# Patient Record
Sex: Female | Born: 1985
Health system: Southern US, Community
[De-identification: ages and names within clinical notes are randomized; demographics above are authoritative.]

## PROBLEM LIST (undated history)

## (undated) DIAGNOSIS — O139 Gestational [pregnancy-induced] hypertension without significant proteinuria, unspecified trimester: Secondary | ICD-10-CM

## (undated) DIAGNOSIS — Z6791 Unspecified blood type, Rh negative: Secondary | ICD-10-CM

## (undated) DIAGNOSIS — R87619 Unspecified abnormal cytological findings in specimens from cervix uteri: Secondary | ICD-10-CM

## (undated) DIAGNOSIS — I219 Acute myocardial infarction, unspecified: Secondary | ICD-10-CM

## (undated) DIAGNOSIS — Z800 Family history of malignant neoplasm of digestive organs: Secondary | ICD-10-CM

## (undated) DIAGNOSIS — O26899 Other specified pregnancy related conditions, unspecified trimester: Secondary | ICD-10-CM

## (undated) DIAGNOSIS — F32A Depression, unspecified: Secondary | ICD-10-CM

## (undated) DIAGNOSIS — O30009 Twin pregnancy, unspecified number of placenta and unspecified number of amniotic sacs, unspecified trimester: Secondary | ICD-10-CM

## (undated) DIAGNOSIS — F419 Anxiety disorder, unspecified: Secondary | ICD-10-CM

## (undated) DIAGNOSIS — J439 Emphysema, unspecified: Secondary | ICD-10-CM

## (undated) HISTORY — DX: Unspecified blood type, rh negative: Z67.91

## (undated) HISTORY — DX: Depression, unspecified: F32.A

## (undated) HISTORY — DX: Emphysema, unspecified: J43.9

## (undated) HISTORY — DX: Unspecified abnormal cytological findings in specimens from cervix uteri: R87.619

## (undated) HISTORY — DX: Other specified pregnancy related conditions, unspecified trimester: O26.899

## (undated) HISTORY — DX: Family history of malignant neoplasm of digestive organs: Z80.0

## (undated) HISTORY — DX: Gestational (pregnancy-induced) hypertension without significant proteinuria, unspecified trimester: O13.9

## (undated) HISTORY — DX: Twin pregnancy, unspecified number of placenta and unspecified number of amniotic sacs, unspecified trimester: O30.009

## (undated) HISTORY — DX: Anxiety disorder, unspecified: F41.9

## (undated) HISTORY — PX: WISDOM TOOTH EXTRACTION: SHX21

## (undated) HISTORY — DX: Acute myocardial infarction, unspecified: I21.9

---

## 2010-09-13 ENCOUNTER — Observation Stay: Payer: Self-pay | Admitting: Obstetrics & Gynecology

## 2010-09-26 ENCOUNTER — Observation Stay: Payer: Self-pay | Admitting: Obstetrics and Gynecology

## 2010-09-27 ENCOUNTER — Inpatient Hospital Stay: Payer: Self-pay

## 2014-01-16 ENCOUNTER — Observation Stay: Payer: Self-pay | Admitting: Obstetrics & Gynecology

## 2014-01-23 ENCOUNTER — Observation Stay: Payer: Self-pay | Admitting: Obstetrics & Gynecology

## 2014-01-30 ENCOUNTER — Observation Stay: Payer: Self-pay | Admitting: Obstetrics & Gynecology

## 2014-02-06 ENCOUNTER — Observation Stay: Payer: Self-pay | Admitting: Obstetrics and Gynecology

## 2014-02-13 ENCOUNTER — Observation Stay: Payer: Self-pay | Admitting: Obstetrics and Gynecology

## 2014-02-13 LAB — PIH PROFILE
Anion Gap: 8 (ref 7–16)
BUN: 9 mg/dL (ref 7–18)
Calcium, Total: 8.9 mg/dL (ref 8.5–10.1)
Chloride: 104 mmol/L (ref 98–107)
Co2: 26 mmol/L (ref 21–32)
Creatinine: 0.71 mg/dL (ref 0.60–1.30)
EGFR (African American): >60
EGFR (Non-African Amer.): >60
Glucose: 100 mg/dL — ABNORMAL HIGH (ref 65–99)
HCT: 36.2 % (ref 35.0–47.0)
HGB: 12.1 g/dL (ref 12.0–16.0)
MCH: 29.7 pg (ref 26.0–34.0)
MCHC: 33.5 g/dL (ref 32.0–36.0)
MCV: 89 fL (ref 80–100)
Osmolality: 274 (ref 275–301)
Platelet: 137 10*3/uL — ABNORMAL LOW (ref 150–440)
Potassium: 3.8 mmol/L (ref 3.5–5.1)
RBC: 4.08 10*6/uL (ref 3.80–5.20)
RDW: 14.2 % (ref 11.5–14.5)
SGOT(AST): 28 U/L (ref 15–37)
Sodium: 138 mmol/L (ref 136–145)
Uric Acid: 5.6 mg/dL (ref 2.6–6.0)
WBC: 7.5 10*3/uL (ref 3.6–11.0)

## 2014-02-13 LAB — PROTEIN / CREATININE RATIO, URINE
Creatinine, Urine: 66.7 mg/dL (ref 30.0–125.0)
Protein, Random Urine: <5 mg/dL — ABNORMAL LOW (ref 0–12)

## 2014-02-17 ENCOUNTER — Other Ambulatory Visit: Payer: Self-pay | Admitting: Obstetrics and Gynecology

## 2014-02-17 LAB — CBC WITH DIFFERENTIAL/PLATELET
Basophil #: 0 10*3/uL (ref 0.0–0.1)
Basophil %: 0.2 %
Eosinophil #: 0 10*3/uL (ref 0.0–0.7)
Eosinophil %: 0.2 %
HCT: 36.3 % (ref 35.0–47.0)
HGB: 12.1 g/dL (ref 12.0–16.0)
Lymphocyte #: 1.4 10*3/uL (ref 1.0–3.6)
Lymphocyte %: 15.7 %
MCH: 29.5 pg (ref 26.0–34.0)
MCHC: 33.5 g/dL (ref 32.0–36.0)
MCV: 88 fL (ref 80–100)
Monocyte #: 0.6 x10 3/mm (ref 0.2–0.9)
Monocyte %: 6.7 %
Neutrophil #: 7 10*3/uL — ABNORMAL HIGH (ref 1.4–6.5)
Neutrophil %: 77.2 %
Platelet: 125 10*3/uL — ABNORMAL LOW (ref 150–440)
RBC: 4.12 10*6/uL (ref 3.80–5.20)
RDW: 14.3 % (ref 11.5–14.5)
WBC: 9.1 10*3/uL (ref 3.6–11.0)

## 2014-02-17 LAB — CREATININE, SERUM
Creatinine: 0.75 mg/dL (ref 0.60–1.30)
EGFR (African American): >60
EGFR (Non-African Amer.): >60

## 2014-02-17 LAB — CREATININE, URINE, RANDOM: Creatinine, Urine Random: 134.4 mg/dL — ABNORMAL HIGH (ref 30.0–125.0)

## 2014-02-17 LAB — HEPATIC FUNCTION PANEL A (ARMC)
Albumin: 2.7 g/dL — ABNORMAL LOW (ref 3.4–5.0)
Alkaline Phosphatase: 184 U/L — ABNORMAL HIGH
Bilirubin, Direct: <0.1 mg/dL (ref 0.00–0.20)
Bilirubin,Total: 0.2 mg/dL (ref 0.2–1.0)
SGOT(AST): 30 U/L (ref 15–37)
SGPT (ALT): 18 U/L (ref 12–78)
Total Protein: 6.6 g/dL (ref 6.4–8.2)

## 2014-02-17 LAB — URIC ACID: Uric Acid: 5.8 mg/dL (ref 2.6–6.0)

## 2014-02-17 LAB — BUN: BUN: 12 mg/dL (ref 7–18)

## 2014-02-17 LAB — PROTEIN, URINE, RANDOM: Protein, Random Urine: 24 mg/dL — ABNORMAL HIGH (ref 0–12)

## 2014-02-19 ENCOUNTER — Inpatient Hospital Stay: Payer: Self-pay | Admitting: Obstetrics and Gynecology

## 2014-02-19 LAB — PROTEIN / CREATININE RATIO, URINE
Creatinine, Urine: 189.8 mg/dL — ABNORMAL HIGH (ref 30.0–125.0)
Protein, Random Urine: 33 mg/dL — ABNORMAL HIGH (ref 0–12)
Protein/Creat. Ratio: 174 mg/gCREAT (ref 0–200)

## 2014-02-19 LAB — CBC WITH DIFFERENTIAL/PLATELET
Basophil #: 0 10*3/uL (ref 0.0–0.1)
Basophil %: 0.3 %
Eosinophil #: 0 10*3/uL (ref 0.0–0.7)
Eosinophil %: 0.5 %
HCT: 35.1 % (ref 35.0–47.0)
HGB: 12 g/dL (ref 12.0–16.0)
Lymphocyte #: 1.4 10*3/uL (ref 1.0–3.6)
Lymphocyte %: 20.5 %
MCH: 30 pg (ref 26.0–34.0)
MCHC: 34.2 g/dL (ref 32.0–36.0)
MCV: 88 fL (ref 80–100)
Monocyte #: 0.5 x10 3/mm (ref 0.2–0.9)
Monocyte %: 7 %
Neutrophil #: 4.9 10*3/uL (ref 1.4–6.5)
Neutrophil %: 71.7 %
Platelet: 119 10*3/uL — ABNORMAL LOW (ref 150–440)
RBC: 4.01 10*6/uL (ref 3.80–5.20)
RDW: 14.2 % (ref 11.5–14.5)
WBC: 6.8 10*3/uL (ref 3.6–11.0)

## 2014-02-20 LAB — HEMATOCRIT: HCT: 33.9 % — ABNORMAL LOW (ref 35.0–47.0)

## 2014-02-21 LAB — PATHOLOGY REPORT

## 2014-10-24 ENCOUNTER — Encounter: Payer: Self-pay | Admitting: *Deleted

## 2014-11-05 ENCOUNTER — Ambulatory Visit: Payer: Self-pay | Admitting: General Surgery

## 2014-11-11 ENCOUNTER — Encounter: Payer: Self-pay | Admitting: *Deleted

## 2015-03-24 NOTE — H&P (Signed)
L&D Evaluation:  History Expanded:  HPI 29 yo G2P1001 at 30w1dgestational age by LMP consistent with 11 week ultrasound.  Pregnancy complicated by Di/Di twins. She presents for routine twin NST.  She is noted to have elevated BPs. She denies headache, visual changes, and RUQ pain.  She notes positive fetal movement x 2 babies, no vaginal bleeding, no leakage of fluid, and no contractions.   Blood Type (Maternal) O negative   Group B Strep Results Maternal (Result >5wks must be treated as unknown) unknown/result > 5 weeks ago   Maternal HIV Negative   Maternal Syphilis Ab Nonreactive   Maternal Varicella Immune   Rubella Results ([facility]aternal) immune   [facility]   Patient's Medical History No Chronic Illness   Patient's Surgical History none   Medications Pre Natal Vitamins   Allergies vicodin   Social History none   Family History Non-Contributory   ROS:  ROS All systems were reviewed.  HEENT, CNS, GI, GU, Respiratory, CV, Renal and Musculoskeletal systems were found to be normal.   Exam:  Vital Signs T98.5, BPs 123-149/61-87, P  80s-90s,   General no apparent distress   Mental Status clear   Chest clear   Heart normal sinus rhythm   Abdomen gravid, non-tender   Back no CVAT   Edema no edema   Mebranes Intact   FHT normal rate with no decels   FHT Description baby A: 135/mod var/+accels/no decels; baby B: 140/mod var/+accels/no decels   Ucx irregular, infrequent   Skin no lesions   Impression:  Impression 1) Intrauterine pregnancy at 358w1destational age, 2) elevated BPs, 3) reactive NST for both fetuses   Plan:  Comments 1) elevated BPs: labs within normal limits.  urine without measurable protein.  no symptoms.  Will have her follow up in 4 days for routine OB and BP check.  Strict precautions given regarding headache, visual changes, RUQ pain, vaginal bleeding, leakage of fluid, fetal movement, and contractions.    2) NSTs: reactive for  both fetuses, cat 1 for both fetuses.   Follow Up Appointment already scheduled   Labs:  Lab Results: Hepatic:  02-Apr-15 15:33   SGOT (AST) 28 (Result(s) reported on 13 Feb 2014 at 03:55PM.)  Routine Chem:  02-Apr-15 15:27   Result Comment PRT/CREA RATIO - UNABLE TO CALCULATE VALUE DUE TO NON-  - NUMERIC VALUE WITHIN THE CALCULATION.  Result(s) reported on 13 Feb 2014 at 05:06PM.    15:33   Uric Acid, Serum 5.6 (Result(s) reported on 13 Feb 2014 at 03:55PM.)  Glucose, Serum  100  BUN 9  Creatinine (comp) 0.71  Sodium, Serum 138  Potassium, Serum 3.8  Chloride, Serum 104  CO2, Serum 26  Osmolality (calc) 274  Anion Gap 8  Calcium (Total), Serum 8.9  eGFR (Non-African American) >60 (eGFR values <606min/1.73 m2 may be an indication of chronic kidney disease (CKD). Calculated eGFR is useful in patients with stable renal function. The eGFR calculation will not be reliable in acutely ill patients when serum creatinine is changing rapidly. It is not useful in  patients on dialysis. The eGFR calculation may not be applicable to patients at the low and high extremes of body sizes, pregnant women, and vegetarians.)  eGFR (African American) >60  Misc Urine Chem:  02-Apr-15 15:27   Creatinine, Urine 66.7  Protein, Random Urine  < 5  Protein/Creat Ratio (comp) SEE COMMENT  Routine Hem:  02-Apr-15 15:33   WBC (CBC) 7.5  RBC (CBC) 4.08  Hemoglobin (CBC) 12.1  Hematocrit (CBC) 36.2  Platelet Count (CBC)  137 (Result(s) reported on 13 Feb 2014 at 03:55PM.)  MCV 89  MCH 29.7  MCHC 33.5  RDW 14.2   Electronic Signatures: Will Bonnet (MD)  (Signed 02-Apr-15 17:28)  Authored: L&D Evaluation, Labs   Last Updated: 02-Apr-15 17:28 by Will Bonnet (MD)

## 2015-03-24 NOTE — H&P (Signed)
L&D Evaluation:  History:  HPI Pt is a 29 yo G2P1001 at [redacted] weeks GA based on a 1st trimester u/s at 11w with a Di-Di twin gestation. She presents today for IOL. Her prenatal course has been significant for resolved placenta previa, and elevated blood pressure at 36 weeks- 130-142/88-90 in the clinic. PIH labs sent at that time. Her prenatal labs are: O-, VI, RI, GBS negative, TDaP given 2/23, RHogam given 2/12. Her twins were vertex/vertex at her last appointment and the patient desires to have a vaginal delivery.   Presents with other, IOL   Patient's Medical History No Chronic Illness   Patient's Surgical History none   Medications Pre Natal Vitamins  omega fatty acids   Allergies other, vicodin   Social History none   Family History Non-Contributory   ROS:  ROS All systems were reviewed.  HEENT, CNS, GI, GU, Respiratory, CV, Renal and Musculoskeletal systems were found to be normal.   Exam:  Vital Signs stable  BP 130's currently   General no apparent distress   Mental Status clear   Chest clear   Heart normal sinus rhythm   Abdomen gravid, non-tender   Back no CVAT   Pelvic 3/90/-2   Mebranes Intact   FHT normal rate with no decels, A- 140's  B- 160 at present   Ucx irregular   Skin dry, no lesions, no rashes   Lymph no lymphadenopathy   Impression:  Impression other, IUP at 37 weeks for Di-DI twins, r/o PIH   Plan:  Plan EFM/NST, pitocin, AROM, epidural for pain, double set up in OR for anticipated SVD   Follow Up Appointment need to schedule   Electronic Signatures: Jannet MantisSubudhi, [place] (CNM)  (Signed 08-Apr-15 09:52)  Authored: L&D Evaluation   Last Updated: 08-Apr-15 09:52 by Jannet MantisSubudhi, Rozella Servello (CNM)

## 2015-03-24 NOTE — H&P (Signed)
L&D Evaluation:  History:  HPI 29 yo G2P1001 at 7523w1d by D=11wk US derived EDC of 03/12/14 with di/di twins presenting for routine scheduled NST.  At the start of her NST the patient displayed a brief 2min decel down to the 70's with return to baseline and reactive tracing thereafter  +FM, no LOF, no VB, no ctx  Last growth scan 01/16/2014 at 334w1d A: 2037g (4lbs 8oz) c/w 55.6%ile SDP 2.1cm B: 1723 (3lb 13oz) c/w 32.3%ile SDP 5.4cm for a discordance of 15%   Presents with NST   Patient's Medical History No Chronic Illness   Patient's Surgical History none   Medications Pre Natal Vitamins   Allergies vicodin   Social History none   Family History Non-Contributory   ROS:  ROS All systems were reviewed.  HEENT, CNS, GI, GU, Respiratory, CV, Renal and Musculoskeletal systems were found to be normal.   Exam:  Vital Signs stable   General no apparent distress   Mental Status clear   Chest no increased work in breathing   Abdomen gravid, non-tender   Estimated Fetal Weight Large for gestational age   Back no CVAT   Edema no edema   FHT A: 140, moderate, +accels, no decels B: 135, moderate variability, +accels, on decels to 70's at start of monitoring   Ucx absent   Impression:  Impression 29 yo G2P1001 at 2823w1d with di/di twin gestation noted to have spontaneous decel on routine scheduled NST   Plan:  Comments 1) NST - will admit for porlonged monitoring.  Reactive tracing on baby A and B since initial decel.  Has follow up growth scan scheduled for 02/10/14  2) O negative / ABSC negative / RI / VZI / HIV neg / HBsAg neg / RPR NR / 1st trimester screen negative / 1-hr OGTT 108     - rhogam given 12/26/13  3) TDAP given 01/06/14, influenza vaccination 09/17/13  4) Disposition Prolonged monitoring, if remains reactive without evidence of other decels discharge home with labor precautions and kick counts   Electronic Signatures: Lorrene ReidStaebler, Adellyn Capek M (MD)  (Signed  26-Mar-15 17:20)  Authored: L&D Evaluation   Last Updated: 26-Mar-15 17:20 by Lorrene ReidStaebler, Emoni Whitworth M (MD)

## 2016-10-18 DIAGNOSIS — Z124 Encounter for screening for malignant neoplasm of cervix: Secondary | ICD-10-CM | POA: Diagnosis not present

## 2016-10-18 DIAGNOSIS — Z30431 Encounter for routine checking of intrauterine contraceptive device: Secondary | ICD-10-CM | POA: Diagnosis not present

## 2016-10-18 DIAGNOSIS — Z01419 Encounter for gynecological examination (general) (routine) without abnormal findings: Secondary | ICD-10-CM | POA: Diagnosis not present

## 2016-10-18 DIAGNOSIS — Z1151 Encounter for screening for human papillomavirus (HPV): Secondary | ICD-10-CM | POA: Diagnosis not present

## 2017-03-06 ENCOUNTER — Other Ambulatory Visit: Payer: Self-pay | Admitting: Obstetrics and Gynecology

## 2017-03-06 MED ORDER — SERTRALINE HCL 50 MG PO TABS: 50.0000 mg | ORAL_TABLET | Freq: Every day | ORAL | 1 refills | Status: DC

## 2017-09-29 ENCOUNTER — Telehealth: Payer: Self-pay

## 2017-09-29 ENCOUNTER — Other Ambulatory Visit: Payer: Self-pay

## 2017-09-29 MED ORDER — SERTRALINE HCL 50 MG PO TABS: 50.0000 mg | ORAL_TABLET | Freq: Every day | ORAL | 2 refills | Status: DC

## 2017-09-29 NOTE — Telephone Encounter (Signed)
Pt calling today requesting refill on zoloft to last to annual appt.

## 2017-09-29 NOTE — Telephone Encounter (Signed)
yes

## 2017-09-29 NOTE — Telephone Encounter (Signed)
Please advise if you want me to refill.  

## 2017-11-16 ENCOUNTER — Ambulatory Visit: Payer: Self-pay | Admitting: Obstetrics and Gynecology

## 2017-12-05 ENCOUNTER — Encounter: Payer: Self-pay | Admitting: Obstetrics and Gynecology

## 2017-12-05 ENCOUNTER — Ambulatory Visit (INDEPENDENT_AMBULATORY_CARE_PROVIDER_SITE_OTHER): Payer: BLUE CROSS/BLUE SHIELD | Admitting: Obstetrics and Gynecology

## 2017-12-05 VITALS — BP 120/78 | HR 59 | Ht 67.0 in | Wt 188.0 lb

## 2017-12-05 DIAGNOSIS — Z713 Dietary counseling and surveillance: Secondary | ICD-10-CM | POA: Diagnosis not present

## 2017-12-05 DIAGNOSIS — Z30431 Encounter for routine checking of intrauterine contraceptive device: Secondary | ICD-10-CM | POA: Diagnosis not present

## 2017-12-05 DIAGNOSIS — F3281 Premenstrual dysphoric disorder: Secondary | ICD-10-CM | POA: Diagnosis not present

## 2017-12-05 DIAGNOSIS — Z01419 Encounter for gynecological examination (general) (routine) without abnormal findings: Secondary | ICD-10-CM | POA: Diagnosis not present

## 2017-12-05 DIAGNOSIS — N920 Excessive and frequent menstruation with regular cycle: Secondary | ICD-10-CM | POA: Diagnosis not present

## 2017-12-05 MED ORDER — SERTRALINE HCL 50 MG PO TABS: 50.0000 mg | ORAL_TABLET | Freq: Every day | ORAL | 3 refills | Status: DC

## 2017-12-05 NOTE — Patient Instructions (Signed)
I value your feedback and entrusting us with your care. If you get a [facility] patient survey, I would appreciate you taking the time to let us know about your experience today. Thank you! 

## 2017-12-05 NOTE — Progress Notes (Signed)
PCP:  Patient, No Pcp Per   Chief Complaint  Patient presents with  . Gynecologic Exam     HPI:      Wendy Vargas is a 32 y.o. [phone] who LMP was Patient's last menstrual period was 11/21/2017., presents today for her annual examination.  Her menses are regular every 28-30 days, lasting 8-10 days, heavy flow for 2 days.  Dysmenorrhea none. She does not have intermenstrual bleeding. She has paragard IUD and periods are worse. She tried lysteda last yr without any sx relief. She has PMDD sx and takes zoloft daily with sx improvement of sx. No side effects. Wants to cont.   Sex activity: single partner, contraception - IUD. Paragard placed 04/22/14. Trying to get husband to have vasectomy so pt can remove IUD.  Last Pap: October 19, 2016  Results were: no abnormalities /neg HPV DNA  Hx of STDs: none  There is no FH of breast cancer. There is no FH of ovarian cancer. The patient does do self-breast exams.  Tobacco use: The patient denies current or previous tobacco use. Alcohol use: social drinker No drug use.  Exercise: very active  She does get adequate calcium and Vitamin D in her diet.  She is still trying to lose wt. Exercises hard 6 days a wk. Gets about 1500 cal a day. Eats protein sources although has given up most meat/all dairy. Up 8 # from last yr. Clothes don't fit differently.    Past Medical History:  Diagnosis Date  . Abnormal Pap smear of cervix    asc-us  . Gestational hypertension   . Rh negative, antepartum, unspecified trimester   . Twin pregnancy     History reviewed. No pertinent surgical history.  Family History  Problem Relation Age of Onset  . Pancreatic cancer Maternal Grandmother   . Stomach cancer Maternal Grandfather     Social History   Socioeconomic History  . Marital status: Married    Spouse name: Not on file  . Number of children: Not on file  . Years of education: Not on file  . Highest education level: Not on file    Social Needs  . Financial resource strain: Not on file  . Food insecurity - worry: Not on file  . Food insecurity - inability: Not on file  . Transportation needs - medical: Not on file  . Transportation needs - non-medical: Not on file  Occupational History  . Not on file  Tobacco Use  . Smoking status: Never Smoker  . Smokeless tobacco: Never Used  Substance and Sexual Activity  . Alcohol use: Yes  . Drug use: No  . Sexual activity: Yes    Birth control/protection: IUD  Other Topics Concern  . Not on file  Social History Narrative  . Not on file    Current Meds  Medication Sig  . PARAGARD INTRAUTERINE COPPER IU by Intrauterine route.     ROS:  Review of Systems  Constitutional: Negative for fatigue, fever and unexpected weight change.  Respiratory: Negative for cough, shortness of breath and wheezing.   Cardiovascular: Negative for chest pain, palpitations and leg swelling.  Gastrointestinal: Negative for blood in stool, constipation, diarrhea, nausea and vomiting.  Endocrine: Negative for cold intolerance, heat intolerance and polyuria.  Genitourinary: Negative for dyspareunia, dysuria, flank pain, frequency, genital sores, hematuria, menstrual problem, pelvic pain, urgency, vaginal bleeding, vaginal discharge and vaginal pain.  Musculoskeletal: Negative for back pain, joint swelling and myalgias.  Skin: Negative  for rash.  Neurological: Negative for dizziness, syncope, light-headedness, numbness and headaches.  Hematological: Negative for adenopathy.  Psychiatric/Behavioral: Negative for agitation, confusion, sleep disturbance and suicidal ideas. The patient is not nervous/anxious.      Objective: BP 120/78   Pulse (!) 59   Ht 5\' 7"  (1.702 m)   Wt 188 lb (85.3 kg)   LMP 11/21/2017   BMI 29.44 kg/m    Physical Exam  Constitutional: She is oriented to person, place, and time. She appears well-developed and well-nourished.  Genitourinary: Vagina normal and  uterus normal. There is no rash or tenderness on the right labia. There is no rash or tenderness on the left labia. No erythema or tenderness in the vagina. No vaginal discharge found. Right adnexum does not display mass and does not display tenderness. Left adnexum does not display mass and does not display tenderness.  Cervix exhibits visible IUD strings. Cervix does not exhibit motion tenderness or polyp. Uterus is not enlarged or tender.  Neck: Normal range of motion. No thyromegaly present.  Cardiovascular: Normal rate, regular rhythm and normal heart sounds.  No murmur heard. Pulmonary/Chest: Effort normal and breath sounds normal. Right breast exhibits no mass, no nipple discharge, no skin change and no tenderness. Left breast exhibits no mass, no nipple discharge, no skin change and no tenderness.  Abdominal: Soft. There is no tenderness. There is no guarding.  Musculoskeletal: Normal range of motion.  Neurological: She is alert and oriented to person, place, and time. No cranial nerve deficit.  Psychiatric: She has a normal mood and affect. Her behavior is normal.  Vitals reviewed.   Assessment/Plan: Encounter for annual routine gynecological examination  Encounter for routine checking of intrauterine contraceptive device (IUD) - Paragard in place. Due for rem 6/25.   Menorrhagia with regular cycle - No improvement with lysteda. Sx for 2 days. Pt ok with IUD currently. Add MVI with iron.  PMDD (premenstrual dysphoric disorder) - Rx RF zoloft. Pt takes daily now with sx improvement overall. - Plan: sertraline (ZOLOFT) 50 MG tablet  Weight loss counseling, encounter for - Cont exercise,adjust caloric intake. Decrease carbs, increase fruits/veggies/cont protein. May need to slightly increase calories.If no change, check labs.   Meds ordered this encounter  Medications  . sertraline (ZOLOFT) 50 MG tablet    Sig: Take 1 tablet (50 mg total) by mouth daily.    Dispense:  90 tablet     Refill:  3             GYN counsel family planning choices, adequate intake of calcium and vitamin D, diet and exercise     F/U  Return in about 1 year (around 12/05/2018).  Alicia B. Copland, PA-C 12/05/2017 12:04 PM

## 2018-12-10 ENCOUNTER — Other Ambulatory Visit: Payer: Self-pay | Admitting: Obstetrics and Gynecology

## 2018-12-10 DIAGNOSIS — F3281 Premenstrual dysphoric disorder: Secondary | ICD-10-CM

## 2018-12-10 NOTE — Telephone Encounter (Signed)
Please advise 

## 2018-12-25 ENCOUNTER — Encounter: Payer: Self-pay | Admitting: Obstetrics and Gynecology

## 2018-12-25 ENCOUNTER — Ambulatory Visit (INDEPENDENT_AMBULATORY_CARE_PROVIDER_SITE_OTHER): Payer: BLUE CROSS/BLUE SHIELD | Admitting: Obstetrics and Gynecology

## 2018-12-25 VITALS — BP 124/90 | HR 85 | Ht 67.0 in | Wt 193.0 lb

## 2018-12-25 DIAGNOSIS — Z30431 Encounter for routine checking of intrauterine contraceptive device: Secondary | ICD-10-CM

## 2018-12-25 DIAGNOSIS — N649 Disorder of breast, unspecified: Secondary | ICD-10-CM

## 2018-12-25 DIAGNOSIS — L988 Other specified disorders of the skin and subcutaneous tissue: Secondary | ICD-10-CM

## 2018-12-25 DIAGNOSIS — Z01419 Encounter for gynecological examination (general) (routine) without abnormal findings: Secondary | ICD-10-CM

## 2018-12-25 DIAGNOSIS — F3281 Premenstrual dysphoric disorder: Secondary | ICD-10-CM

## 2018-12-25 DIAGNOSIS — R030 Elevated blood-pressure reading, without diagnosis of hypertension: Secondary | ICD-10-CM

## 2018-12-25 MED ORDER — SERTRALINE HCL 50 MG PO TABS: 50.0000 mg | ORAL_TABLET | Freq: Every day | ORAL | 3 refills | Status: DC

## 2018-12-25 NOTE — Progress Notes (Signed)
PCP:  Patient, No Pcp Per   Chief Complaint  Patient presents with  . Gynecologic Exam     HPI:      [name] is a 33 y.o. [phone] who LMP was Patient's last menstrual period was 12/12/2018 (approximate)., presents today for her annual examination.  Her menses are regular every 28-30 days, lasting 8-10 days, heavy flow for 2 days.  Dysmenorrhea none. She does not have intermenstrual bleeding. She has paragard IUD and periods are worse. She tried lysteda in past without any sx relief. She has PMDD sx and takes zoloft daily with sx improvement of sx. No side effects. Wants to cont.   Sex activity: single partner, contraception - IUD. Paragard placed 04/22/14. Trying to get husband to have vasectomy so pt can remove IUD.  Last Pap: October 19, 2016  Results were: no abnormalities /neg HPV DNA  Hx of STDs: none  There is no FH of breast cancer. There is no FH of ovarian cancer. The patient does do self-breast exams. Pt noticed a red area on the LT breast a few days ago. Itchy and looks like previous "ringworm". Started OTC antifungal med.   Tobacco use: The patient denies current or previous tobacco use. Alcohol use: social drinker No drug use.  Exercise: very active  She does get adequate calcium and Vitamin D in her diet.   Past Medical History:  Diagnosis Date  . Abnormal Pap smear of cervix    asc-us  . Gestational hypertension   . Rh negative, antepartum, unspecified trimester   . Twin pregnancy     History reviewed. No pertinent surgical history.  Family History  Problem Relation Age of Onset  . Pancreatic cancer Maternal Grandmother   . Stomach cancer Maternal Grandfather     Social History   Socioeconomic History  . Marital status: Married    Spouse name: Not on file  . Number of children: Not on file  . Years of education: Not on file  . Highest education level: Not on file  Occupational History  . Not on file  Social Needs  . Financial  resource strain: Not on file  . Food insecurity:    Worry: Not on file    Inability: Not on file  . Transportation needs:    Medical: Not on file    Non-medical: Not on file  Tobacco Use  . Smoking status: Never Smoker  . Smokeless tobacco: Never Used  Substance and Sexual Activity  . Alcohol use: Yes  . Drug use: No  . Sexual activity: Yes    Birth control/protection: I.U.D.    Comment: Paragard  Lifestyle  . Physical activity:    Days per week: Not on file    Minutes per session: Not on file  . Stress: Not on file  Relationships  . Social connections:    Talks on phone: Not on file    Gets together: Not on file    Attends religious service: Not on file    Active member of club or organization: Not on file    Attends meetings of clubs or organizations: Not on file    Relationship status: Not on file  . Intimate partner violence:    Fear of current or ex partner: Not on file    Emotionally abused: Not on file    Physically abused: Not on file    Forced sexual activity: Not on file  Other Topics Concern  . Not on file  Social History Narrative  . Not on file    Current Meds  Medication Sig  . PARAGARD INTRAUTERINE COPPER IU by Intrauterine route.  . sertraline (ZOLOFT) 50 MG tablet Take 1 tablet (50 mg total) by mouth daily.  . [DISCONTINUED] sertraline (ZOLOFT) 50 MG tablet Take 1 tablet (50 mg total) by mouth daily.     ROS:  Review of Systems  Constitutional: Positive for fatigue. Negative for fever and unexpected weight change.  Respiratory: Negative for cough, shortness of breath and wheezing.   Cardiovascular: Negative for chest pain, palpitations and leg swelling.  Gastrointestinal: Negative for blood in stool, constipation, diarrhea, nausea and vomiting.  Endocrine: Negative for cold intolerance, heat intolerance and polyuria.  Genitourinary: Negative for dyspareunia, dysuria, flank pain, frequency, genital sores, hematuria, menstrual problem, pelvic  pain, urgency, vaginal bleeding, vaginal discharge and vaginal pain.  Musculoskeletal: Negative for back pain, joint swelling and myalgias.  Skin: Positive for rash.  Neurological: Negative for dizziness, syncope, light-headedness, numbness and headaches.  Hematological: Negative for adenopathy.  Psychiatric/Behavioral: Positive for agitation and dysphoric mood. Negative for confusion, sleep disturbance and suicidal ideas. The patient is not nervous/anxious.      Objective: BP 124/90   Pulse 85   Ht 5\' 7"  (1.702 m)   Wt 193 lb (87.5 kg)   LMP 12/12/2018 (Approximate)   BMI 30.23 kg/m    Physical Exam Constitutional:      Appearance: She is well-developed.  Genitourinary:     Vulva, vagina, uterus, right adnexa and left adnexa normal.     No vulval lesion or tenderness noted.     No vaginal discharge, erythema or tenderness.     No cervical motion tenderness or polyp.     IUD strings visualized.     Uterus is not enlarged or tender.     No right or left adnexal mass present.     Right adnexa not tender.     Left adnexa not tender.  Neck:     Musculoskeletal: Normal range of motion.     Thyroid: No thyromegaly.  Cardiovascular:     Rate and Rhythm: Normal rate and regular rhythm.     Heart sounds: Normal heart sounds. No murmur.  Pulmonary:     Effort: Pulmonary effort is normal.     Breath sounds: Normal breath sounds.  Chest:     Breasts:        Right: No mass, nipple discharge, skin change or tenderness.        Left: No mass, nipple discharge, skin change or tenderness.  Abdominal:     Palpations: Abdomen is soft.     Tenderness: There is no abdominal tenderness. There is no guarding.  Musculoskeletal: Normal range of motion.  Neurological:     Mental Status: She is alert and oriented to person, place, and time.     Cranial Nerves: No cranial nerve deficit.  Skin:      Psychiatric:        Behavior: Behavior normal.  Vitals signs reviewed.      Assessment/Plan: Encounter for annual routine gynecological examination  Encounter for routine checking of intrauterine contraceptive device (IUD) - Paragard in place. Pt tolerant of it for now. Hopes husband gets vasectomy so it can be removed.  PMDD (premenstrual dysphoric disorder) - Rx RF zoloft. Pt takes daily with sx improvement overall. - Plan: sertraline (ZOLOFT) 50 MG tablet  Elevated blood pressure reading in office without diagnosis of hypertension - BP improved after exam.  pt to monitor.  Lesion of skin of breast - Question tinea vs other. pt to cont clotrimazole. F/u wiht derm if persists/worsens (has appt in a few wks anyway).  Meds ordered this encounter  Medications  . sertraline (ZOLOFT) 50 MG tablet    Sig: Take 1 tablet (50 mg total) by mouth daily.    Dispense:  90 tablet    Refill:  3    Order Specific Question:   Supervising Provider    Answer:   Nadara MustardHARRIS, ROBERT P [[id number]]             GYN counsel family planning choices, adequate intake of calcium and vitamin D, diet and exercise     F/U  Return in about 1 year (around 12/26/2019).  Elicia Lui B. Tamila Gaulin, PA-C 12/25/2018 1:54 PM

## 2018-12-25 NOTE — Patient Instructions (Signed)
I value your feedback and entrusting us with your care. If you get a [facility] patient survey, I would appreciate you taking the time to let us know about your experience today. Thank you! 

## 2020-01-16 ENCOUNTER — Other Ambulatory Visit: Payer: Self-pay | Admitting: Obstetrics and Gynecology

## 2020-01-16 ENCOUNTER — Telehealth: Payer: Self-pay | Admitting: Obstetrics and Gynecology

## 2020-01-16 DIAGNOSIS — F3281 Premenstrual dysphoric disorder: Secondary | ICD-10-CM

## 2020-01-16 MED ORDER — SERTRALINE HCL 50 MG PO TABS: 50.0000 mg | ORAL_TABLET | Freq: Every day | ORAL | 0 refills | Status: DC

## 2020-01-16 NOTE — Progress Notes (Signed)
Rx RF zoloft till 3/21 annual

## 2020-01-16 NOTE — Telephone Encounter (Signed)
Patient needs refill on generic zoloft, annual scheduled 3/25, if she can get enough to get to that appt.  [facility].

## 2020-01-16 NOTE — Telephone Encounter (Signed)
Called pt, no answer, voice mail not set up.

## 2020-01-16 NOTE — Telephone Encounter (Signed)
Psl notify pt rx eRxd

## 2020-01-16 NOTE — Telephone Encounter (Signed)
Rx eRxd.  

## 2020-02-06 ENCOUNTER — Ambulatory Visit: Payer: Self-pay | Admitting: Obstetrics and Gynecology

## 2020-03-05 ENCOUNTER — Encounter: Payer: Self-pay | Admitting: Obstetrics and Gynecology

## 2020-03-05 ENCOUNTER — Other Ambulatory Visit: Payer: Self-pay

## 2020-03-05 ENCOUNTER — Ambulatory Visit (INDEPENDENT_AMBULATORY_CARE_PROVIDER_SITE_OTHER): Payer: BC Managed Care – PPO | Admitting: Obstetrics and Gynecology

## 2020-03-05 VITALS — BP 130/90 | Ht 67.0 in | Wt 199.0 lb

## 2020-03-05 DIAGNOSIS — Z800 Family history of malignant neoplasm of digestive organs: Secondary | ICD-10-CM | POA: Insufficient documentation

## 2020-03-05 DIAGNOSIS — Z30431 Encounter for routine checking of intrauterine contraceptive device: Secondary | ICD-10-CM

## 2020-03-05 DIAGNOSIS — Z01419 Encounter for gynecological examination (general) (routine) without abnormal findings: Secondary | ICD-10-CM | POA: Diagnosis not present

## 2020-03-05 DIAGNOSIS — F3281 Premenstrual dysphoric disorder: Secondary | ICD-10-CM | POA: Diagnosis not present

## 2020-03-05 DIAGNOSIS — Z808 Family history of malignant neoplasm of other organs or systems: Secondary | ICD-10-CM | POA: Diagnosis not present

## 2020-03-05 MED ORDER — SERTRALINE HCL 50 MG PO TABS: 50.0000 mg | ORAL_TABLET | Freq: Every day | ORAL | 3 refills | Status: DC

## 2020-03-05 NOTE — Progress Notes (Signed)
PCP:  Patient, No Pcp Per   Chief Complaint  Patient presents with  . Gynecologic Exam    mood swings before cycle starts     HPI:      [name] is a 34 y.o. [phone] who LMP was Patient's last menstrual period was 02/13/2020 (approximate)., presents today for her annual examination.  Her menses are regular every 28-30 days, lasting 14 days, heavy flow for 2 days, spotting before and after for 4 days. Dysmenorrhea none. She does not have intermenstrual bleeding. She has paragard IUD and periods are worse. She tried lysteda in past without any sx relief. Plans to get IUD removed after husband has vasectomy and hopes bleeding is better.  She has PMDD sx and takes zoloft daily with sx improvement of sx, except still has a 2-3 days before some periods with worse moods (can't find any triggers). Has tried taking 100 mg dose that days but that makes her lethargic. No other side effects. Wants to cont.   Sex activity: single partner, contraception - IUD. Paragard placed 04/22/14. Husband to have vasectomy so pt can remove IUD.  Last Pap: October 19, 2016  Results were: no abnormalities /neg HPV DNA  Hx of STDs: none  There is no FH of breast cancer. There is no FH of ovarian cancer. The patient does do self-breast exams. There is a FH of pancreatic cancer in her MGM, genetic testing not done.  Tobacco use: The patient denies current or previous tobacco use. Alcohol use: social drinker No drug use.  Exercise: very active  She does get adequate calcium but not Vitamin D in her diet.   Past Medical History:  Diagnosis Date  . Abnormal Pap smear of cervix    asc-us  . Gestational hypertension   . Rh negative, antepartum, unspecified trimester   . Twin pregnancy     History reviewed. No pertinent surgical history.  Family History  Problem Relation Age of Onset  . Pancreatic cancer Maternal Grandmother 52  . Stomach cancer Maternal Grandfather     Social History    Socioeconomic History  . Marital status: Married    Spouse name: Not on file  . Number of children: Not on file  . Years of education: Not on file  . Highest education level: Not on file  Occupational History  . Not on file  Tobacco Use  . Smoking status: Never Smoker  . Smokeless tobacco: Never Used  Substance and Sexual Activity  . Alcohol use: Yes  . Drug use: No  . Sexual activity: Yes    Birth control/protection: I.U.D.    Comment: Paragard  Other Topics Concern  . Not on file  Social History Narrative  . Not on file   Social Determinants of Health   Financial Resource Strain:   . Difficulty of Paying Living Expenses:   Food Insecurity:   . Worried About Charity fundraiser in the Last Year:   . Arboriculturist in the Last Year:   Transportation Needs:   . Film/video editor (Medical):   Marland Kitchen Lack of Transportation (Non-Medical):   Physical Activity:   . Days of Exercise per Week:   . Minutes of Exercise per Session:   Stress:   . Feeling of Stress :   Social Connections:   . Frequency of Communication with Friends and Family:   . Frequency of Social Gatherings with Friends and Family:   . Attends Religious Services:   .  Active Member of Clubs or Organizations:   . Attends Archivist Meetings:   Marland Kitchen Marital Status:   Intimate Partner Violence:   . Fear of Current or Ex-Partner:   . Emotionally Abused:   Marland Kitchen Physically Abused:   . Sexually Abused:     Current Meds  Medication Sig  . PARAGARD INTRAUTERINE COPPER IU by Intrauterine route.  . sertraline (ZOLOFT) 50 MG tablet Take 1 tablet (50 mg total) by mouth daily.  . [DISCONTINUED] sertraline (ZOLOFT) 50 MG tablet Take 1 tablet (50 mg total) by mouth daily.     ROS:  Review of Systems  Constitutional: Positive for fatigue. Negative for fever and unexpected weight change.  Respiratory: Negative for cough, shortness of breath and wheezing.   Cardiovascular: Negative for chest pain,  palpitations and leg swelling.  Gastrointestinal: Negative for blood in stool, constipation, diarrhea, nausea and vomiting.  Endocrine: Negative for cold intolerance, heat intolerance and polyuria.  Genitourinary: Negative for dyspareunia, dysuria, flank pain, frequency, genital sores, hematuria, menstrual problem, pelvic pain, urgency, vaginal bleeding, vaginal discharge and vaginal pain.  Musculoskeletal: Positive for arthralgias. Negative for back pain, joint swelling and myalgias.  Skin: Positive for rash.  Neurological: Negative for dizziness, syncope, light-headedness, numbness and headaches.  Hematological: Negative for adenopathy.  Psychiatric/Behavioral: Positive for agitation and dysphoric mood. Negative for confusion, sleep disturbance and suicidal ideas. The patient is not nervous/anxious.      Objective: BP 130/90   Ht 5' 7" (1.702 m)   Wt 199 lb (90.3 kg)   LMP 02/13/2020 (Approximate)   BMI 31.17 kg/m    Physical Exam Constitutional:      Appearance: She is well-developed.  Genitourinary:     Vulva, vagina, cervix, uterus, right adnexa and left adnexa normal.     No vulval lesion or tenderness noted.     No vaginal discharge, erythema or tenderness.     No cervical polyp.     IUD strings visualized.     Uterus is not enlarged or tender.     No right or left adnexal mass present.     Right adnexa not tender.     Left adnexa not tender.  Neck:     Thyroid: No thyromegaly.  Cardiovascular:     Rate and Rhythm: Normal rate and regular rhythm.     Heart sounds: Normal heart sounds. No murmur.  Pulmonary:     Effort: Pulmonary effort is normal.     Breath sounds: Normal breath sounds.  Chest:     Breasts:        Right: No mass, nipple discharge, skin change or tenderness.        Left: No mass, nipple discharge, skin change or tenderness.  Abdominal:     Palpations: Abdomen is soft.     Tenderness: There is no abdominal tenderness. There is no guarding.    Musculoskeletal:        General: Normal range of motion.     Cervical back: Normal range of motion.  Neurological:     General: No focal deficit present.     Mental Status: She is alert and oriented to person, place, and time.     Cranial Nerves: No cranial nerve deficit.  Skin:    General: Skin is warm and dry.  Psychiatric:        Mood and Affect: Mood normal.        Behavior: Behavior normal.        Thought Content: Thought  content normal.        Judgment: Judgment normal.  Vitals reviewed.     Assessment/Plan: Encounter for annual routine gynecological examination  Encounter for routine checking of intrauterine contraceptive device (IUD); IUD in place. Pt will have removed after vasectomy. Will then see if menses improve. Can check GYN u/s if menorrhagia persists.  PMDD (premenstrual dysphoric disorder) - Plan: sertraline (ZOLOFT) 50 MG tablet; Rx RF. Pt takes daily. Increase to 1 1/2 tabs 1 wk before menses for sx "flare".  Family history of pancreatic cancer - Plan: Integrated [facility] ([place]); MyRisk testing today. Will call with results.   Meds ordered this encounter  Medications  . sertraline (ZOLOFT) 50 MG tablet    Sig: Take 1 tablet (50 mg total) by mouth daily.    Dispense:  90 tablet    Refill:  3    Order Specific Question:   Supervising Provider    Answer:   Gae Dry [[id number]]             GYN counsel adequate intake of calcium and vitamin D, diet and exercise     F/U  Return in about 1 year (around 03/05/2021).  Alicia B. Copland, PA-C 03/05/2020 10:54 AM

## 2020-03-05 NOTE — Patient Instructions (Signed)
I value your feedback and entrusting us with your care. If you get a [facility] patient survey, I would appreciate you taking the time to let us know about your experience today. Thank you!  As of October 24, 2019, your lab results will be released to your MyChart immediately, before I even have a chance to see them. Please give me time to review them and contact you if there are any abnormalities. Thank you for your patience.  

## 2020-03-14 DIAGNOSIS — Z1371 Encounter for nonprocreative screening for genetic disease carrier status: Secondary | ICD-10-CM

## 2020-03-14 HISTORY — DX: Encounter for nonprocreative screening for genetic disease carrier status: Z13.71

## 2020-03-20 ENCOUNTER — Encounter: Payer: Self-pay | Admitting: Obstetrics and Gynecology

## 2020-04-02 ENCOUNTER — Telehealth: Payer: Self-pay | Admitting: Obstetrics and Gynecology

## 2020-04-02 NOTE — Telephone Encounter (Signed)
Pt aware of neg MyRisk results with MSH6 VUS. IBIS=12.7%/riskscore=15.1%. Nothing further to do at this point.   Patient understands these results only apply to her and her children, and this is not indicative of genetic testing results of her other family members. It is recommended that her other family members have genetic testing done.  Pt also understands negative genetic testing doesn't mean she will never get any of these cancers.   Hard copy mailed to pt. F/u prn.

## 2020-05-21 ENCOUNTER — Other Ambulatory Visit: Payer: Self-pay | Admitting: Obstetrics and Gynecology

## 2020-05-21 DIAGNOSIS — F3281 Premenstrual dysphoric disorder: Secondary | ICD-10-CM

## 2020-06-29 ENCOUNTER — Telehealth: Payer: Self-pay

## 2020-06-29 NOTE — Telephone Encounter (Signed)
Pt calling; zoloft wasn't sent in; is almost out.  [phone]

## 2020-06-29 NOTE — Telephone Encounter (Signed)
Pls check with pharm and f/u with pt. 1 yr RF sent 4/21

## 2020-06-29 NOTE — Telephone Encounter (Signed)
Called [facility] where Rx was sent in. Pharmacist says pt never picked it up, will get it ready for her. Called pt to make her aware. She apologizes thinking [facility] was [facility] to [facility] (which she says she hates it there). [facility] removed from preferred pharmacies.

## 2020-11-16 ENCOUNTER — Other Ambulatory Visit: Payer: Self-pay

## 2020-11-16 ENCOUNTER — Emergency Department: Payer: BC Managed Care – PPO

## 2020-11-16 ENCOUNTER — Emergency Department
Admission: EM | Admit: 2020-11-16 | Discharge: 2020-11-16 | Disposition: A | Discharge status: Home / Self Care | Payer: BC Managed Care – PPO | Attending: Emergency Medicine | Admitting: Emergency Medicine

## 2020-11-16 ENCOUNTER — Encounter: Payer: Self-pay | Admitting: Emergency Medicine

## 2020-11-16 DIAGNOSIS — I10 Essential (primary) hypertension: Secondary | ICD-10-CM

## 2020-11-16 DIAGNOSIS — G44209 Tension-type headache, unspecified, not intractable: Secondary | ICD-10-CM | POA: Diagnosis not present

## 2020-11-16 DIAGNOSIS — Z79899 Other long term (current) drug therapy: Secondary | ICD-10-CM | POA: Insufficient documentation

## 2020-11-16 DIAGNOSIS — R519 Headache, unspecified: Secondary | ICD-10-CM | POA: Diagnosis not present

## 2020-11-16 MED ORDER — KETOROLAC TROMETHAMINE 10 MG PO TABS: 10.0000 mg | ORAL_TABLET | Freq: Four times a day (QID) | ORAL | 0 refills | Status: DC | PRN

## 2020-11-16 MED ORDER — AMLODIPINE BESYLATE 5 MG PO TABS: 5.0000 mg | ORAL_TABLET | Freq: Every day | ORAL | 1 refills | Status: DC

## 2020-11-16 MED ORDER — DIPHENHYDRAMINE HCL 25 MG PO CAPS
25.0000 mg | ORAL_CAPSULE | Freq: Once | ORAL | Status: AC
  Administered 2020-11-16: 25 mg via ORAL
  Filled 2020-11-16: qty 1

## 2020-11-16 MED ORDER — DIPHENHYDRAMINE HCL 25 MG PO CAPS: 50.0000 mg | ORAL_CAPSULE | Freq: Four times a day (QID) | ORAL | 0 refills | Status: DC | PRN

## 2020-11-16 MED ORDER — METOCLOPRAMIDE HCL 10 MG PO TABS: 10.0000 mg | ORAL_TABLET | Freq: Four times a day (QID) | ORAL | 0 refills | Status: DC | PRN

## 2020-11-16 MED ORDER — KETOROLAC TROMETHAMINE 10 MG PO TABS
10.0000 mg | ORAL_TABLET | Freq: Once | ORAL | Status: AC
  Administered 2020-11-16: 10 mg via ORAL
  Filled 2020-11-16: qty 1

## 2020-11-16 MED ORDER — METOCLOPRAMIDE HCL 10 MG PO TABS
10.0000 mg | ORAL_TABLET | Freq: Once | ORAL | Status: AC
  Administered 2020-11-16: 10 mg via ORAL
  Filled 2020-11-16: qty 1

## 2020-11-16 NOTE — ED Triage Notes (Signed)
Pt comes into the ED via POV c/o headache that started yesterday.  PT denies any h/o migraines.  Pt Pt states she had a negative COVID test this morning as well.  Pt states the headache is better when she is laying down and the light are turned down.  Pt admits tot nausea, but denies any dizziness.  Pt currently neurologically intact at this time and in NAD.

## 2020-11-16 NOTE — ED Provider Notes (Signed)
[facility] Emergency Department Provider Note  ____________________________________________  Time seen: Approximately 5:36 PM  I have reviewed the triage vital signs and the nursing notes.   HISTORY  Chief Complaint Headache    HPI Wendy Vargas is a 35 y.o. female with a past history of gestational hypertension and preeclampsia who comes ED complaining of generalized headache that started yesterday.  Feels worse in the bilateral upper neck, radiates forward over the top of the scalp.  Constant, better laying down with the lights off.  No aggravating factors.  Associated nausea, no vomiting or vision change.  No change rebuilds coordination or motor strength.  Never had a headache like this before, does not typically get headaches or migraines.  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  She took a Covid test this morning which she reports is negative.      Past Medical History:  Diagnosis Date  . Abnormal Pap smear of cervix    asc-us  . BRCA negative 03/2020   MyRisk neg; IBIS=12.7%/riskscore=15.1%  . Family history of pancreatic cancer   . Gestational hypertension    pre-eclampsia  . Rh negative, antepartum, unspecified trimester   . Twin pregnancy      Patient Active Problem List   Diagnosis Date Noted  . Family history of pancreatic cancer 03/05/2020  . PMDD (premenstrual dysphoric disorder) 12/05/2017     History reviewed. No pertinent surgical history.   Prior to Admission medications   Medication Sig Start Date End Date Taking? Authorizing Provider  amLODipine (NORVASC) 5 MG tablet Take 1 tablet (5 mg total) by mouth daily. 11/16/20  Yes Carrie Mew, MD  diphenhydrAMINE (BENADRYL) 25 mg capsule Take 2 capsules (50 mg total) by mouth every 6 (six) hours as needed. 11/16/20  Yes Carrie Mew, MD  ketorolac (TORADOL) 10  MG tablet Take 1 tablet (10 mg total) by mouth every 6 (six) hours as needed for moderate pain. 11/16/20  Yes Carrie Mew, MD  metoCLOPramide (REGLAN) 10 MG tablet Take 1 tablet (10 mg total) by mouth every 6 (six) hours as needed. 11/16/20  Yes Carrie Mew, MD  PARAGARD INTRAUTERINE COPPER IU by Intrauterine route.    [provider]  sertraline (ZOLOFT) 50 MG tablet Take 1 tablet (50 mg total) by mouth daily. 2/54/27   Copland, Deirdre Evener, PA-C     Allergies Vicodin [hydrocodone-acetaminophen]   Family History  Problem Relation Age of Onset  . Pancreatic cancer Maternal Grandmother 59  . Stomach cancer Maternal Grandfather     Social History Social History   Tobacco Use  . Smoking status: Never Smoker  . Smokeless tobacco: Never Used  Vaping Use  . Vaping Use: Never used  Substance Use Topics  . Alcohol use: Yes  . Drug use: No    Review of Systems  Constitutional:   No fever or chills.  ENT:   No sore throat. No rhinorrhea. Cardiovascular:   No chest pain or syncope. Respiratory:   No dyspnea or cough. Gastrointestinal:   Negative for abdominal pain, vomiting and diarrhea.  Musculoskeletal:   Negative for focal pain or swelling All other systems reviewed and are negative except as documented above in ROS and HPI.  ____________________________________________   PHYSICAL EXAM:  VITAL SIGNS: ED Triage Vitals  Enc Vitals Group     BP 11/16/20 1623 (!) 178/98  Pulse Rate 11/16/20 1623 63     Resp 11/16/20 1623 18     Temp 11/16/20 1623 98.4 F (36.9 C)     Temp Source 11/16/20 1623 Oral     SpO2 11/16/20 1623 100 %     Weight 11/16/20 1624 200 lb (90.7 kg)     Height 11/16/20 1624 '5\' 7"'  (1.702 m)     Head Circumference --      Peak Flow --      Pain Score 11/16/20 1624 10     Pain Loc --      Pain Edu? --      Excl. in [place]? --     Vital signs reviewed, nursing assessments reviewed.   Constitutional:   Alert and oriented. Non-toxic  appearance. Eyes:   Conjunctivae are normal. EOMI. PERRL. ENT      Head:   Normocephalic and atraumatic.      Nose:   Wearing a mask.      Mouth/Throat:   Wearing a mask.      Neck:   No meningismus. Full ROM.  There is tenderness of the neck musculature at the skull base bilaterally reproducing the pain. Cardiovascular:   RRR.  Respiratory:   Unlabored breathing  Musculoskeletal:   Normal range of motion in all extremities. . Neurologic:   Normal speech and language.  Cranial nerves II through XII intact Motor grossly intact. Normal gait.  No pronator drift.  Normal cerebellar function No acute focal neurologic deficits are appreciated.  Skin:    Skin is warm, dry and intact. No rash noted.  No petechiae, purpura, or bullae.  ____________________________________________    LABS (pertinent positives/negatives) (all labs ordered are listed, but only abnormal results are displayed) Labs Reviewed - No data to display ____________________________________________   EKG    ____________________________________________    RADIOLOGY  CT Head Wo Contrast  Result Date: 11/16/2020 CLINICAL DATA:  35 year old female with headache. EXAM: CT HEAD WITHOUT CONTRAST TECHNIQUE: Contiguous axial images were obtained from the base of the skull through the vertex without intravenous contrast. COMPARISON:  None. FINDINGS: Brain: No evidence of acute infarction, hemorrhage, hydrocephalus, extra-axial collection or mass lesion/mass effect. Vascular: No hyperdense vessel or unexpected calcification. Skull: Normal. Negative for fracture or focal lesion. Sinuses/Orbits: No acute finding. Other: None IMPRESSION: Normal noncontrast CT of the brain. Electronically Signed   By: Anner Crete M.D.   On: 11/16/2020 17:03    ____________________________________________   PROCEDURES Procedures  ____________________________________________  DIFFERENTIAL DIAGNOSIS   Surgical hemorrhage, tension  headache, dehydration.  Doubt meningitis encephalitis aneurysm dissection or intracranial hypertension.  CLINICAL IMPRESSION / ASSESSMENT AND PLAN / ED COURSE  Medications ordered in the ED: Medications  ketorolac (TORADOL) tablet 10 mg (10 mg Oral Given 11/16/20 1659)  metoCLOPramide (REGLAN) tablet 10 mg (10 mg Oral Given 11/16/20 1659)  diphenhydrAMINE (BENADRYL) capsule 25 mg (25 mg Oral Given 11/16/20 1659)    Pertinent labs & imaging results that were available during my care of the patient were reviewed by me and considered in my medical decision making (see chart for details).  ELIANE HAMMERSMITH was evaluated in Emergency Department on 11/16/2020 for the symptoms described in the history of present illness. She was evaluated in the context of the global COVID-19 pandemic, which necessitated consideration that the patient might be at risk for infection with the SARS-CoV-2 virus that causes COVID-19. Institutional protocols and algorithms that pertain to the evaluation of patients at risk for COVID-19 are in  a state of rapid change based on information released by regulatory bodies including the CDC and federal and state organizations. These policies and algorithms were followed during the patient's care in the ED.   Patient presents with headache for the past 2 days which matches a tension headache pattern.  She reports it is severe and is associated with elevated blood pressure and she is not a person with a visual migraines, so CT scan obtained which is negative for intracranial hemorrhage.  Reproducibility on exam is reassuring, improvement with lying down is reassuring.  Will treat symptomatically, recommend outpatient follow-up.   ----------------------------------------- 5:58 PM on 11/16/2020 -----------------------------------------  Blood pressure slightly improved at 170/90.  Will start on amlodipine.  Patient will follow up in 1 week for blood pressure and symptom recheck.      ____________________________________________   FINAL CLINICAL IMPRESSION(S) / ED DIAGNOSES    Final diagnoses:  Acute non intractable tension-type headache  Hypertension, unspecified type     ED Discharge Orders         Ordered    amLODipine (NORVASC) 5 MG tablet  Daily        11/16/20 1757    metoCLOPramide (REGLAN) 10 MG tablet  Every 6 hours PRN        11/16/20 1757    ketorolac (TORADOL) 10 MG tablet  Every 6 hours PRN        11/16/20 1757    diphenhydrAMINE (BENADRYL) 25 mg capsule  Every 6 hours PRN        11/16/20 1757          Portions of this note were generated with dragon dictation software. Dictation errors may occur despite best attempts at proofreading.   Carrie Mew, MD 11/16/20 1758

## 2020-11-20 ENCOUNTER — Other Ambulatory Visit: Payer: Self-pay | Admitting: Obstetrics and Gynecology

## 2020-11-20 ENCOUNTER — Encounter: Payer: Self-pay | Admitting: Obstetrics and Gynecology

## 2020-11-20 MED ORDER — KETOROLAC TROMETHAMINE 10 MG PO TABS
10.0000 mg | ORAL_TABLET | Freq: Four times a day (QID) | ORAL | 0 refills | Status: DC | PRN
Start: 2020-11-20 — End: 2021-01-13

## 2020-11-20 NOTE — Progress Notes (Signed)
Rx RF toradol prn headache

## 2021-01-13 ENCOUNTER — Encounter: Payer: Self-pay | Admitting: Physician Assistant

## 2021-01-13 ENCOUNTER — Ambulatory Visit: Payer: BC Managed Care – PPO | Admitting: Physician Assistant

## 2021-01-13 ENCOUNTER — Other Ambulatory Visit: Payer: Self-pay

## 2021-01-13 VITALS — BP 140/82 | HR 74 | Temp 98.4°F | Wt 217.6 lb

## 2021-01-13 DIAGNOSIS — F3281 Premenstrual dysphoric disorder: Secondary | ICD-10-CM | POA: Diagnosis not present

## 2021-01-13 DIAGNOSIS — E669 Obesity, unspecified: Secondary | ICD-10-CM | POA: Diagnosis not present

## 2021-01-13 DIAGNOSIS — Z6834 Body mass index (BMI) 34.0-34.9, adult: Secondary | ICD-10-CM

## 2021-01-13 DIAGNOSIS — R519 Headache, unspecified: Secondary | ICD-10-CM

## 2021-01-13 NOTE — Progress Notes (Signed)
New patient visit   Patient: Wendy Vargas   DOB: [birth year]   35 y.o. Female  MRN: [id number] Visit Date: 01/13/2021  Today's healthcare provider: Trinna Post, PA-C   Chief Complaint  Patient presents with  . New Patient (Initial Visit)  I,Porsha C McClurkin,acting as a scribe for Trinna Post, PA-C.,have documented all relevant documentation on the behalf of Trinna Post, PA-C,as directed by  Trinna Post, PA-C while in the presence of Trinna Post, PA-C.  Subjective    Wendy Vargas is a 35 y.o. female who presents today as a new patient to establish care.  HPI   Anxiety:currently taking Sertraline 50 mg. She gets this from [facility] and feels she is doing well.   Migraines: was taking metoclopramide and toradol, but currently not taking anything. She reports headaches have resolved since visit to the ER last month.   Weight Gain: Reports she has gained 10 pounds per year for the last two years. Reports working out and eating at a calorie deficit but not losing weight. Last labs 2015.   Wt Readings from Last 3 Encounters:  01/13/21 217 lb 9.6 oz (98.7 kg)  11/16/20 200 lb (90.7 kg)  03/05/20 199 lb (90.3 kg)     Past Medical History:  Diagnosis Date  . Abnormal Pap smear of cervix    asc-us  . Anxiety   . BRCA negative 03/2020   MyRisk neg; IBIS=12.7%/riskscore=15.1%  . Emphysema of lung ([place])   . Family history of pancreatic cancer   . Gestational hypertension    pre-eclampsia  . Myocardial infarction ([place])   . Rh negative, antepartum, unspecified trimester   . Twin pregnancy    History reviewed. No pertinent surgical history. Family Status  Relation Name Status  . MGM  Deceased  . MGF  Alive  . Mother  Alive  . Father  Alive  . PGM  (Not Specified)   Family History  Problem Relation Age of Onset  . Pancreatic cancer Maternal Grandmother 20  . Stomach cancer Maternal Grandfather   . Depression Mother   . Diabetes Father    . Congestive Heart Failure Paternal Grandmother    Social History   Socioeconomic History  . Marital status: Married    Spouse name: Not on file  . Number of children: Not on file  . Years of education: Not on file  . Highest education level: Not on file  Occupational History  . Not on file  Tobacco Use  . Smoking status: Never Smoker  . Smokeless tobacco: Never Used  Vaping Use  . Vaping Use: Never used  Substance and Sexual Activity  . Alcohol use: Yes    Alcohol/week: 5.0 standard drinks    Types: 3 Glasses of wine, 2 Shots of liquor per week  . Drug use: No  . Sexual activity: Yes    Birth control/protection: I.U.D.    Comment: Paragard  Other Topics Concern  . Not on file  Social History Narrative  . Not on file   Social Determinants of Health   Financial Resource Strain: Not on file  Food Insecurity: Not on file  Transportation Needs: Not on file  Physical Activity: Not on file  Stress: Not on file  Social Connections: Not on file   Outpatient Medications Prior to Visit  Medication Sig  . PARAGARD INTRAUTERINE COPPER IU by Intrauterine route.  . sertraline (ZOLOFT) 50 MG tablet Take 1 tablet (50 mg total) by  mouth daily.  Marland Kitchen amLODipine (NORVASC) 5 MG tablet Take 1 tablet (5 mg total) by mouth daily. (Patient not taking: Reported on 01/13/2021)  . diphenhydrAMINE (BENADRYL) 25 mg capsule Take 2 capsules (50 mg total) by mouth every 6 (six) hours as needed. (Patient not taking: Reported on 01/13/2021)  . ketorolac (TORADOL) 10 MG tablet Take 1 tablet (10 mg total) by mouth every 6 (six) hours as needed for moderate pain. (Patient not taking: Reported on 01/13/2021)  . metoCLOPramide (REGLAN) 10 MG tablet Take 1 tablet (10 mg total) by mouth every 6 (six) hours as needed. (Patient not taking: Reported on 01/13/2021)   No facility-administered medications prior to visit.   Allergies  Allergen Reactions  . Vicodin [Hydrocodone-Acetaminophen]     Immunization History   Administered Date(s) Administered  . Influenza-Unspecified 11/20/2017  . Moderna Sars-Covid-2 Vaccination 01/27/2020, 02/24/2020    Health Maintenance  Topic Date Due  . Hepatitis C Screening  Never done  . HIV Screening  Never done  . TETANUS/TDAP  Never done  . INFLUENZA VACCINE  06/14/2020  . COVID-19 Vaccine (3 - Booster for Moderna series) 08/25/2020  . PAP SMEAR-Modifier  10/18/2021  . HPV VACCINES  Aged Out    Patient Care Team: Paulene Floor as PCP - General (Physician Assistant)  Review of Systems  Constitutional: Positive for fatigue and unexpected weight change.  HENT: Negative.   Eyes: Negative.   Respiratory: Negative.   Cardiovascular: Negative.   Gastrointestinal: Positive for abdominal distention, constipation and diarrhea.  Endocrine: Negative.   Genitourinary: Negative.   Musculoskeletal: Negative.   Skin: Negative.   Allergic/Immunologic: Negative.   Neurological: Positive for headaches.  Hematological: Negative.   Psychiatric/Behavioral: Positive for decreased concentration. The patient is nervous/anxious.       Objective    BP 140/82 (BP Location: Left Arm, Patient Position: Sitting, Cuff Size: Large)   Pulse 74   Temp 98.4 F (36.9 C) (Oral)   Wt 217 lb 9.6 oz (98.7 kg)   LMP 01/03/2021   SpO2 99%   BMI 34.08 kg/m  Physical Exam Constitutional:      Appearance: Normal appearance.  HENT:     Right Ear: Tympanic membrane, ear canal and external ear normal.     Left Ear: Tympanic membrane, ear canal and external ear normal.  Cardiovascular:     Rate and Rhythm: Normal rate and regular rhythm.     Pulses: Normal pulses.     Heart sounds: Normal heart sounds.  Pulmonary:     Effort: Pulmonary effort is normal.     Breath sounds: Normal breath sounds.  Abdominal:     General: Abdomen is flat. Bowel sounds are normal.     Palpations: Abdomen is soft.  Skin:    General: Skin is warm and dry.  Neurological:     General: No  focal deficit present.     Mental Status: She is alert and oriented to person, place, and time.  Psychiatric:        Mood and Affect: Mood normal.        Behavior: Behavior normal.      Depression Screen PHQ 2/9 Scores 01/13/2021  PHQ - 2 Score 2  PHQ- 9 Score 10   No results found for any visits on 01/13/21.  Assessment & Plan     1. Class 1 obesity without serious comorbidity with body mass index (BMI) of 34.0 to 34.9 in adult, unspecified obesity type  Check labs as below.  Offered referrals to nutrition or medical weight management. Patient would like to pursue labwork first.  - TSH - Lipid panel - Comprehensive metabolic panel - CBC with Differential/Platelet  2. PMDD (premenstrual dysphoric disorder)  Continue zoloft.   3. Frequent headaches  Resolved.    No follow-ups on file.     ITrinna Post, PA-C, have reviewed all documentation for this visit. The documentation on 01/13/21 for the exam, diagnosis, procedures, and orders are all accurate and complete.  The entirety of the information documented in the History of Present Illness, Review of Systems and Physical Exam were personally obtained by me. Portions of this information were initially documented by [facility] and reviewed by me for thoroughness and accuracy.     Paulene Floor  [facility] [phone] (phone) [phone] (fax)  [place]

## 2021-01-13 NOTE — Patient Instructions (Signed)
Health Maintenance, Female Adopting a healthy lifestyle and getting preventive care are important in promoting health and wellness. Ask your health care provider about:  The right schedule for you to have regular tests and exams.  Things you can do on your own to prevent diseases and keep yourself healthy. What should I know about diet, weight, and exercise? Eat a healthy diet  Eat a diet that includes plenty of vegetables, fruits, low-fat dairy products, and lean protein.  Do not eat a lot of foods that are high in solid fats, added sugars, or sodium.   Maintain a healthy weight Body mass index (BMI) is used to identify weight problems. It estimates body fat based on height and weight. Your health care provider can help determine your BMI and help you achieve or maintain a healthy weight. Get regular exercise Get regular exercise. This is one of the most important things you can do for your health. Most adults should:  Exercise for at least 150 minutes each week. The exercise should increase your heart rate and make you sweat (moderate-intensity exercise).  Do strengthening exercises at least twice a week. This is in addition to the moderate-intensity exercise.  Spend less time sitting. Even light physical activity can be beneficial. Watch cholesterol and blood lipids Have your blood tested for lipids and cholesterol at 37 years of age, then have this test every 5 years. Have your cholesterol levels checked more often if:  Your lipid or cholesterol levels are high.  You are older than 37 years of age.  You are at high risk for heart disease. What should I know about cancer screening? Depending on your health history and family history, you may need to have cancer screening at various ages. This may include screening for:  Breast cancer.  Cervical cancer.  Colorectal cancer.  Skin cancer.  Lung cancer. What should I know about heart disease, diabetes, and high blood  pressure? Blood pressure and heart disease  High blood pressure causes heart disease and increases the risk of stroke. This is more likely to develop in people who have high blood pressure readings, are of African descent, or are overweight.  Have your blood pressure checked: ? Every 3-5 years if you are 18-39 years of age. ? Every year if you are 40 years old or older. Diabetes Have regular diabetes screenings. This checks your fasting blood sugar level. Have the screening done:  Once every three years after age 40 if you are at a normal weight and have a low risk for diabetes.  More often and at a younger age if you are overweight or have a high risk for diabetes. What should I know about preventing infection? Hepatitis B If you have a higher risk for hepatitis B, you should be screened for this virus. Talk with your health care provider to find out if you are at risk for hepatitis B infection. Hepatitis C Testing is recommended for:  Everyone born from 1945 through 1965.  Anyone with known risk factors for hepatitis C. Sexually transmitted infections (STIs)  Get screened for STIs, including gonorrhea and chlamydia, if: ? You are sexually active and are younger than 37 years of age. ? You are older than 37 years of age and your health care provider tells you that you are at risk for this type of infection. ? Your sexual activity has changed since you were last screened, and you are at increased risk for chlamydia or gonorrhea. Ask your health care provider   if you are at risk.  Ask your health care provider about whether you are at high risk for HIV. Your health care provider may recommend a prescription medicine to help prevent HIV infection. If you choose to take medicine to prevent HIV, you should first get tested for HIV. You should then be tested every 3 months for as long as you are taking the medicine. Pregnancy  If you are about to stop having your period (premenopausal) and  you may become pregnant, seek counseling before you get pregnant.  Take 400 to 800 micrograms (mcg) of folic acid every day if you become pregnant.  Ask for birth control (contraception) if you want to prevent pregnancy. Osteoporosis and menopause Osteoporosis is a disease in which the bones lose minerals and strength with aging. This can result in bone fractures. If you are 65 years old or older, or if you are at risk for osteoporosis and fractures, ask your health care provider if you should:  Be screened for bone loss.  Take a calcium or vitamin D supplement to lower your risk of fractures.  Be given hormone replacement therapy (HRT) to treat symptoms of menopause. Follow these instructions at home: Lifestyle  Do not use any products that contain nicotine or tobacco, such as cigarettes, e-cigarettes, and chewing tobacco. If you need help quitting, ask your health care provider.  Do not use street drugs.  Do not share needles.  Ask your health care provider for help if you need support or information about quitting drugs. Alcohol use  Do not drink alcohol if: ? Your health care provider tells you not to drink. ? You are pregnant, may be pregnant, or are planning to become pregnant.  If you drink alcohol: ? Limit how much you use to 0-1 drink a day. ? Limit intake if you are breastfeeding.  Be aware of how much alcohol is in your drink. In the [place] one drink equals one 12 oz bottle of beer (355 mL), one 5 oz glass of wine (148 mL), or one 1 oz glass of hard liquor (44 mL). General instructions  Schedule regular health, dental, and eye exams.  Stay current with your vaccines.  Tell your health care provider if: ? You often feel depressed. ? You have ever been abused or do not feel safe at home. Summary  Adopting a healthy lifestyle and getting preventive care are important in promoting health and wellness.  Follow your health care provider's instructions about healthy  diet, exercising, and getting tested or screened for diseases.  Follow your health care provider's instructions on monitoring your cholesterol and blood pressure. This information is not intended to replace advice given to you by your health care provider. Make sure you discuss any questions you have with your health care provider. Document Revised: 10/24/2018 Document Reviewed: 10/24/2018 Elsevier Patient Education  2021 Elsevier Inc.  

## 2021-01-14 LAB — COMPREHENSIVE METABOLIC PANEL
ALT: 17 IU/L (ref 0–32)
AST: 24 IU/L (ref 0–40)
Albumin/Globulin Ratio: 1.7 (ref 1.2–2.2)
Albumin: 4.8 g/dL (ref 3.8–4.8)
Alkaline Phosphatase: 68 IU/L (ref 44–121)
BUN/Creatinine Ratio: 19 (ref 9–23)
BUN: 16 mg/dL (ref 6–20)
Bilirubin Total: <0.2 mg/dL (ref 0.0–1.2)
CO2: 22 mmol/L (ref 20–29)
Calcium: 9.5 mg/dL (ref 8.7–10.2)
Chloride: 100 mmol/L (ref 96–106)
Creatinine, Ser: 0.85 mg/dL (ref 0.57–1.00)
Globulin, Total: 2.9 g/dL (ref 1.5–4.5)
Glucose: 91 mg/dL (ref 65–99)
Potassium: 4.5 mmol/L (ref 3.5–5.2)
Sodium: 141 mmol/L (ref 134–144)
Total Protein: 7.7 g/dL (ref 6.0–8.5)
eGFR: 91 mL/min/{1.73_m2} (ref >59)

## 2021-01-14 LAB — CBC WITH DIFFERENTIAL/PLATELET
Basophils Absolute: 0.1 10*3/uL (ref 0.0–0.2)
Basos: 1 %
EOS (ABSOLUTE): 0.1 10*3/uL (ref 0.0–0.4)
Eos: 1 %
Hematocrit: 39.7 % (ref 34.0–46.6)
Hemoglobin: 13.4 g/dL (ref 11.1–15.9)
Immature Grans (Abs): 0 10*3/uL (ref 0.0–0.1)
Immature Granulocytes: 0 %
Lymphocytes Absolute: 2.2 10*3/uL (ref 0.7–3.1)
Lymphs: 31 %
MCH: 29.6 pg (ref 26.6–33.0)
MCHC: 33.8 g/dL (ref 31.5–35.7)
MCV: 88 fL (ref 79–97)
Monocytes Absolute: 0.6 10*3/uL (ref 0.1–0.9)
Monocytes: 9 %
Neutrophils Absolute: 4 10*3/uL (ref 1.4–7.0)
Neutrophils: 58 %
Platelets: 292 10*3/uL (ref 150–450)
RBC: 4.52 x10E6/uL (ref 3.77–5.28)
RDW: 12.4 % (ref 11.7–15.4)
WBC: 7 10*3/uL (ref 3.4–10.8)

## 2021-01-14 LAB — LIPID PANEL
Chol/HDL Ratio: 2.7 ratio (ref 0.0–4.4)
Cholesterol, Total: 216 mg/dL — ABNORMAL HIGH (ref 100–199)
HDL: 79 mg/dL (ref >39)
LDL Chol Calc (NIH): 122 mg/dL — ABNORMAL HIGH (ref 0–99)
Triglycerides: 88 mg/dL (ref 0–149)
VLDL Cholesterol Cal: 15 mg/dL (ref 5–40)

## 2021-01-14 LAB — TSH: TSH: 1.41 u[IU]/mL (ref 0.450–4.500)

## 2021-05-09 ENCOUNTER — Other Ambulatory Visit: Payer: Self-pay | Admitting: Obstetrics and Gynecology

## 2021-05-09 DIAGNOSIS — F3281 Premenstrual dysphoric disorder: Secondary | ICD-10-CM

## 2021-06-18 ENCOUNTER — Telehealth: Payer: Self-pay

## 2021-06-18 NOTE — Telephone Encounter (Signed)
Pt calling; needs sertraline refill; is out; had PE in April c PCP; can she get a refill then have an appt?  [phone]

## 2021-06-18 NOTE — Telephone Encounter (Signed)
Patient is scheduled for 07/27/21 with [facility] for annual. Please advise refill

## 2021-06-19 ENCOUNTER — Other Ambulatory Visit: Payer: Self-pay | Admitting: Obstetrics and Gynecology

## 2021-06-19 DIAGNOSIS — F3281 Premenstrual dysphoric disorder: Secondary | ICD-10-CM

## 2021-06-19 MED ORDER — SERTRALINE HCL 50 MG PO TABS: 50.0000 mg | ORAL_TABLET | Freq: Every day | ORAL | 0 refills | Status: DC

## 2021-06-19 NOTE — Telephone Encounter (Signed)
Rx RF eRxd.  

## 2021-06-19 NOTE — Progress Notes (Signed)
Rx RF sertraline till annual 

## 2021-06-21 NOTE — Telephone Encounter (Signed)
Pt aware.

## 2021-07-27 ENCOUNTER — Ambulatory Visit: Payer: BC Managed Care – PPO | Admitting: Obstetrics and Gynecology

## 2021-07-27 NOTE — Progress Notes (Deleted)
PCP:  Trinna Post, PA-C   No chief complaint on file.    HPI:      [name] is a 35 y.o. [phone] who LMP was No LMP recorded. (Menstrual status: IUD)., presents today for her annual examination.  Her menses are regular every 28-30 days, lasting 14 days, heavy flow for 2 days, spotting before and after for 4 days. Dysmenorrhea none. She does not have intermenstrual bleeding. She has paragard IUD and periods are worse. She tried lysteda in past without any sx relief. Plans to get IUD removed after husband has vasectomy and hopes bleeding is better.  She has PMDD sx and takes zoloft daily with sx improvement of sx, except still has a 2-3 days before some periods with worse moods (can't find any triggers). Has tried taking 100 mg dose that days but that makes her lethargic. No other side effects. Wants to cont.   Sex activity: single partner, contraception - IUD. Paragard placed 04/22/14. Husband to have vasectomy so pt can remove IUD.  Last Pap: October 19, 2016  Results were: no abnormalities /neg HPV DNA  Hx of STDs: none  There is no FH of breast cancer. There is no FH of ovarian cancer. The patient does do self-breast exams. There is a FH of pancreatic cancer in her MGM. Pt is neg MyRisk results with MSH6 VUS. IBIS=12.7%/riskscore=15.1%  Tobacco use: The patient denies current or previous tobacco use. Alcohol use: social drinker No drug use.  Exercise: very active  She does get adequate calcium but not Vitamin D in her diet.   Past Medical History:  Diagnosis Date   Abnormal Pap smear of cervix    asc-us   Anxiety    BRCA negative 03/2020   MyRisk neg; IBIS=12.7%/riskscore=15.1%   Emphysema of lung ([facility])    Family history of pancreatic cancer    Gestational hypertension    pre-eclampsia   Myocardial infarction ([place])    Rh negative, antepartum, unspecified trimester    Twin pregnancy     No past surgical history on file.  Family History  Problem  Relation Age of Onset   Pancreatic cancer Maternal Grandmother 41   Stomach cancer Maternal Grandfather    Depression Mother    Diabetes Father    Congestive Heart Failure Paternal Grandmother     Social History   Socioeconomic History   Marital status: Married    Spouse name: Not on file   Number of children: Not on file   Years of education: Not on file   Highest education level: Not on file  Occupational History   Not on file  Tobacco Use   Smoking status: Never   Smokeless tobacco: Never  Vaping Use   Vaping Use: Never used  Substance and Sexual Activity   Alcohol use: Yes    Alcohol/week: 5.0 standard drinks    Types: 3 Glasses of wine, 2 Shots of liquor per week   Drug use: No   Sexual activity: Yes    Birth control/protection: I.U.D.    Comment: Paragard  Other Topics Concern   Not on file  Social History Narrative   Not on file   Social Determinants of Health   Financial Resource Strain: Not on file  Food Insecurity: Not on file  Transportation Needs: Not on file  Physical Activity: Not on file  Stress: Not on file  Social Connections: Not on file  Intimate Partner Violence: Not on file    No  outpatient medications have been marked as taking for the 07/27/21 encounter (Appointment) with Kendyl Festa, Deirdre Evener, PA-C.     ROS:  Review of Systems  Constitutional:  Positive for fatigue. Negative for fever and unexpected weight change.  Respiratory:  Negative for cough, shortness of breath and wheezing.   Cardiovascular:  Negative for chest pain, palpitations and leg swelling.  Gastrointestinal:  Negative for blood in stool, constipation, diarrhea, nausea and vomiting.  Endocrine: Negative for cold intolerance, heat intolerance and polyuria.  Genitourinary:  Negative for dyspareunia, dysuria, flank pain, frequency, genital sores, hematuria, menstrual problem, pelvic pain, urgency, vaginal bleeding, vaginal discharge and vaginal pain.  Musculoskeletal:   Positive for arthralgias. Negative for back pain, joint swelling and myalgias.  Skin:  Positive for rash.  Neurological:  Negative for dizziness, syncope, light-headedness, numbness and headaches.  Hematological:  Negative for adenopathy.  Psychiatric/Behavioral:  Positive for agitation and dysphoric mood. Negative for confusion, sleep disturbance and suicidal ideas. The patient is not nervous/anxious.     Objective: There were no vitals taken for this visit.   Physical Exam Constitutional:      Appearance: She is well-developed.  Genitourinary:     Vulva normal.     No vaginal discharge, erythema or tenderness.      Right Adnexa: not tender and no mass present.    Left Adnexa: not tender and no mass present.    No cervical polyp.     IUD strings visualized.     Uterus is not enlarged or tender.  Breasts:    Right: No mass, nipple discharge, skin change or tenderness.     Left: No mass, nipple discharge, skin change or tenderness.  Neck:     Thyroid: No thyromegaly.  Cardiovascular:     Rate and Rhythm: Normal rate and regular rhythm.     Heart sounds: Normal heart sounds. No murmur heard. Pulmonary:     Effort: Pulmonary effort is normal.     Breath sounds: Normal breath sounds.  Abdominal:     Palpations: Abdomen is soft.     Tenderness: There is no abdominal tenderness. There is no guarding.  Musculoskeletal:        General: Normal range of motion.     Cervical back: Normal range of motion.  Neurological:     General: No focal deficit present.     Mental Status: She is alert and oriented to person, place, and time.     Cranial Nerves: No cranial nerve deficit.  Skin:    General: Skin is warm and dry.  Psychiatric:        Mood and Affect: Mood normal.        Behavior: Behavior normal.        Thought Content: Thought content normal.        Judgment: Judgment normal.  Vitals reviewed.    Assessment/Plan: Encounter for annual routine gynecological  examination  Encounter for routine checking of intrauterine contraceptive device (IUD); IUD in place. Pt will have removed after vasectomy. Will then see if menses improve. Can check GYN u/s if menorrhagia persists.  PMDD (premenstrual dysphoric disorder) - Plan: sertraline (ZOLOFT) 50 MG tablet; Rx RF. Pt takes daily. Increase to 1 1/2 tabs 1 wk before menses for sx "flare".  Family history of pancreatic cancer - Plan: Integrated BRACAnalysis ([place]); MyRisk testing today. Will call with results.   No orders of the defined types were placed in this encounter.  GYN counsel adequate intake of calcium and vitamin D, diet and exercise     F/U  No follow-ups on file.  Chrishelle Zito B. Vessie Olmsted, PA-C 07/27/2021 11:23 AM

## 2021-10-14 ENCOUNTER — Other Ambulatory Visit: Payer: Self-pay | Admitting: Obstetrics and Gynecology

## 2021-10-14 DIAGNOSIS — F3281 Premenstrual dysphoric disorder: Secondary | ICD-10-CM

## 2021-10-15 NOTE — Telephone Encounter (Signed)
Pt called triage needing 30 day refill of zoloft to last until her next appointment.

## 2021-10-16 ENCOUNTER — Other Ambulatory Visit: Payer: Self-pay | Admitting: Obstetrics and Gynecology

## 2021-10-16 MED ORDER — SERTRALINE HCL 50 MG PO TABS
50.0000 mg | ORAL_TABLET | Freq: Every day | ORAL | 0 refills | Status: DC
Start: 2021-10-16 — End: 2021-11-16

## 2021-10-20 ENCOUNTER — Ambulatory Visit: Payer: Self-pay | Admitting: Obstetrics and Gynecology

## 2021-11-16 ENCOUNTER — Ambulatory Visit (INDEPENDENT_AMBULATORY_CARE_PROVIDER_SITE_OTHER): Payer: BC Managed Care – PPO | Admitting: Obstetrics and Gynecology

## 2021-11-16 ENCOUNTER — Encounter: Payer: Self-pay | Admitting: Obstetrics and Gynecology

## 2021-11-16 ENCOUNTER — Other Ambulatory Visit: Payer: Self-pay

## 2021-11-16 ENCOUNTER — Other Ambulatory Visit (HOSPITAL_COMMUNITY)
Admission: RE | Admit: 2021-11-16 | Discharge: 2021-11-16 | Disposition: A | Discharge status: Home / Self Care | Payer: BC Managed Care – PPO | Source: Ambulatory Visit | Attending: Obstetrics and Gynecology | Admitting: Obstetrics and Gynecology

## 2021-11-16 VITALS — BP 120/90 | Ht 67.0 in | Wt 229.0 lb

## 2021-11-16 DIAGNOSIS — Z124 Encounter for screening for malignant neoplasm of cervix: Secondary | ICD-10-CM

## 2021-11-16 DIAGNOSIS — F3281 Premenstrual dysphoric disorder: Secondary | ICD-10-CM

## 2021-11-16 DIAGNOSIS — Z01419 Encounter for gynecological examination (general) (routine) without abnormal findings: Secondary | ICD-10-CM

## 2021-11-16 DIAGNOSIS — Z1151 Encounter for screening for human papillomavirus (HPV): Secondary | ICD-10-CM

## 2021-11-16 DIAGNOSIS — Z30011 Encounter for initial prescription of contraceptive pills: Secondary | ICD-10-CM

## 2021-11-16 DIAGNOSIS — Z30432 Encounter for removal of intrauterine contraceptive device: Secondary | ICD-10-CM

## 2021-11-16 MED ORDER — SERTRALINE HCL 50 MG PO TABS: 50.0000 mg | ORAL_TABLET | Freq: Every day | ORAL | 3 refills | Status: DC

## 2021-11-16 MED ORDER — DROSPIRENONE-ETHINYL ESTRADIOL 3-0.02 MG PO TABS: 1.0000 | ORAL_TABLET | Freq: Every day | ORAL | 3 refills | Status: DC

## 2021-11-16 NOTE — Progress Notes (Signed)
PCP:  Trinna Post, PA-C (Inactive)   Chief Complaint  Patient presents with   Gynecologic Exam    IUD removal, not sure of new BC     HPI:      Wendy Vargas is a 36 y.o. [phone] who LMP was Patient's last menstrual period was 10/29/2021 (approximate)., presents today for her annual examination.  Her menses are regular every 28-30 days, lasting 7-10 days, heavy flow for 2-3 days, changing products hourly, with quarter to half dollar sized clots. Has BTB, no dysmen. She has paragard IUD and periods are worse. She tried lysteda in past without any sx relief. Planned to get IUD removed after husband had vasectomy but he hasn't done it. She wants IUD removed today anyway. She has PMDD sx and takes zoloft 50 mg daily with sx improvement of sx. Has tried taking 100 mg dose that days but that makes her lethargic.   Sex activity: single partner, contraception - IUD. Paragard placed 04/22/14. Pt wants removed due to menses, and wants to do OCPs again till husband has vasectomy. No hx of HTN, DVTs, migraines with aura. Did OCPs years ago without side effects.  Last Pap: October 19, 2016  Results were: no abnormalities /neg HPV DNA  Hx of STDs: none  There is no FH of breast cancer. There is no FH of ovarian cancer. The patient does do self-breast exams. There is a FH of pancreatic cancer in her MGM. Pt is MyRisk neg 5/21, IBIS=12.7%/riskscore=15.1%.  Tobacco use: The patient denies current or previous tobacco use. Alcohol use: social drinker No drug use.  Exercise: mod active  She does get adequate calcium but not Vitamin D in her diet.   Past Medical History:  Diagnosis Date   Abnormal Pap smear of cervix    asc-us   Anxiety    BRCA negative 03/2020   MyRisk neg; IBIS=12.7%/riskscore=15.1%   Emphysema of lung (HCC)    Family history of pancreatic cancer    Gestational hypertension    pre-eclampsia   Myocardial infarction ([place])    Rh negative, antepartum, unspecified  trimester    Twin pregnancy     Past Surgical History:  Procedure Laterality Date   WISDOM TOOTH EXTRACTION      Family History  Problem Relation Age of Onset   Depression Mother    Diabetes Father    Pancreatic cancer Maternal Grandmother 17   Stomach cancer Maternal Grandfather    Congestive Heart Failure Paternal Grandmother     Social History   Socioeconomic History   Marital status: Married    Spouse name: Not on file   Number of children: Not on file   Years of education: Not on file   Highest education level: Not on file  Occupational History   Not on file  Tobacco Use   Smoking status: Never   Smokeless tobacco: Never  Vaping Use   Vaping Use: Never used  Substance and Sexual Activity   Alcohol use: Yes    Alcohol/week: 5.0 standard drinks    Types: 3 Glasses of wine, 2 Shots of liquor per week   Drug use: No   Sexual activity: Yes    Birth control/protection: I.U.D.    Comment: Paragard  Other Topics Concern   Not on file  Social History Narrative   Not on file   Social Determinants of Health   Financial Resource Strain: Not on file  Food Insecurity: Not on file  Transportation Needs: Not  on file  Physical Activity: Not on file  Stress: Not on file  Social Connections: Not on file  Intimate Partner Violence: Not on file    Current Meds  Medication Sig   drospirenone-ethinyl estradiol (YAZ) 3-0.02 MG tablet Take 1 tablet by mouth daily.   [DISCONTINUED] PARAGARD INTRAUTERINE COPPER IU by Intrauterine route.   [DISCONTINUED] sertraline (ZOLOFT) 50 MG tablet Take 1 tablet (50 mg total) by mouth daily.     ROS:  Review of Systems  Constitutional:  Negative for fatigue, fever and unexpected weight change.  Respiratory:  Negative for cough, shortness of breath and wheezing.   Cardiovascular:  Negative for chest pain, palpitations and leg swelling.  Gastrointestinal:  Negative for blood in stool, constipation, diarrhea, nausea and vomiting.   Endocrine: Negative for cold intolerance, heat intolerance and polyuria.  Genitourinary:  Negative for dyspareunia, dysuria, flank pain, frequency, genital sores, hematuria, menstrual problem, pelvic pain, urgency, vaginal bleeding, vaginal discharge and vaginal pain.  Musculoskeletal:  Negative for back pain, joint swelling and myalgias.  Skin:  Negative for rash.  Neurological:  Negative for dizziness, syncope, light-headedness, numbness and headaches.  Hematological:  Negative for adenopathy.  Psychiatric/Behavioral:  Negative for agitation, confusion, sleep disturbance and suicidal ideas. The patient is not nervous/anxious.     Objective: BP 120/90    Ht '5\' 7"'  (1.702 m)    Wt 229 lb (103.9 kg)    LMP 10/29/2021 (Approximate)    BMI 35.87 kg/m    Physical Exam Constitutional:      Appearance: She is well-developed.  Genitourinary:     Vulva normal.     Right Labia: No rash, tenderness or lesions.    Left Labia: No tenderness, lesions or rash.    No vaginal discharge, erythema or tenderness.      Right Adnexa: not tender and no mass present.    Left Adnexa: not tender and no mass present.    No cervical friability or polyp.     IUD strings visualized.     Uterus is not enlarged or tender.  Breasts:    Right: No mass, nipple discharge, skin change or tenderness.     Left: No mass, nipple discharge, skin change or tenderness.  Neck:     Thyroid: No thyromegaly.  Cardiovascular:     Rate and Rhythm: Normal rate and regular rhythm.     Heart sounds: Normal heart sounds. No murmur heard. Pulmonary:     Effort: Pulmonary effort is normal.     Breath sounds: Normal breath sounds.  Abdominal:     Palpations: Abdomen is soft.     Tenderness: There is no abdominal tenderness. There is no guarding or rebound.  Musculoskeletal:        General: Normal range of motion.     Cervical back: Normal range of motion.  Lymphadenopathy:     Cervical: No cervical adenopathy.   Neurological:     General: No focal deficit present.     Mental Status: She is alert and oriented to person, place, and time.     Cranial Nerves: No cranial nerve deficit.  Skin:    General: Skin is warm and dry.  Psychiatric:        Mood and Affect: Mood normal.        Behavior: Behavior normal.        Thought Content: Thought content normal.        Judgment: Judgment normal.  Vitals reviewed.   IUD Removal Strings of  IUD identified and grasped.  IUD removed without problem with ring forceps.  Pt tolerated this well.  IUD noted to be intact.   Assessment/Plan: Encounter for annual routine gynecological examination  Cervical cancer screening - Plan: Cytology - PAP  Screening for HPV (human papillomavirus) - Plan: Cytology - PAP  Encounter for initial prescription of contraceptive pills - Plan: drospirenone-ethinyl estradiol (YAZ) 3-0.02 MG tablet; OCP start today, Rx eRxd. Condoms for 1 wk. F/u prn.   PMDD (premenstrual dysphoric disorder) - Plan: sertraline (ZOLOFT) 50 MG tablet; Rx RF. May be able to wean off after yaz start. F/u prn.   Encounter for IUD removal--pt tolerated well. IUD intact   Meds ordered this encounter  Medications   drospirenone-ethinyl estradiol (YAZ) 3-0.02 MG tablet    Sig: Take 1 tablet by mouth daily.    Dispense:  90 tablet    Refill:  3    Order Specific Question:   Supervising Provider    Answer:   Gae Dry [[id number]]   sertraline (ZOLOFT) 50 MG tablet    Sig: Take 1 tablet (50 mg total) by mouth daily.    Dispense:  90 tablet    Refill:  3    Order Specific Question:   Supervising Provider    Answer:   Gae Dry [[id number]]             GYN counsel adequate intake of calcium and vitamin D, diet and exercise     F/U  Return in about 1 year (around 11/16/2022).  Dohn Stclair B. Elgie Landino, PA-C 11/16/2021 2:20 PM

## 2021-11-16 NOTE — Patient Instructions (Signed)
I value your feedback and you entrusting us with your care. If you get a [facility] patient survey, I would appreciate you taking the time to let us know about your experience today. Thank you! ? ? ?

## 2021-11-22 LAB — CYTOLOGY - PAP
Comment: NEGATIVE
Diagnosis: NEGATIVE
High risk HPV: NEGATIVE

## 2021-12-12 IMAGING — CT CT HEAD W/O CM
3 series · 16 of 47 positions shown, 19 images · non-contrast
Comparison: None.

CLINICAL DATA: 34-year-old female with headache.

EXAM:
CT HEAD WITHOUT CONTRAST
TECHNIQUE: Contiguous axial images were obtained from the base of the skull
through the vertex without intravenous contrast.

[Series 2: head wo · axial · 0.47mm/px · z∈[-155,-30]mm · 10 of 30 slices shown, 13 images]
[im 3/30  brain]
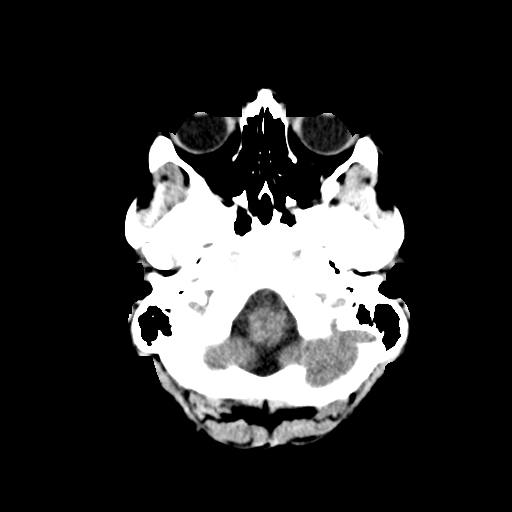
[im 3/30  bone]
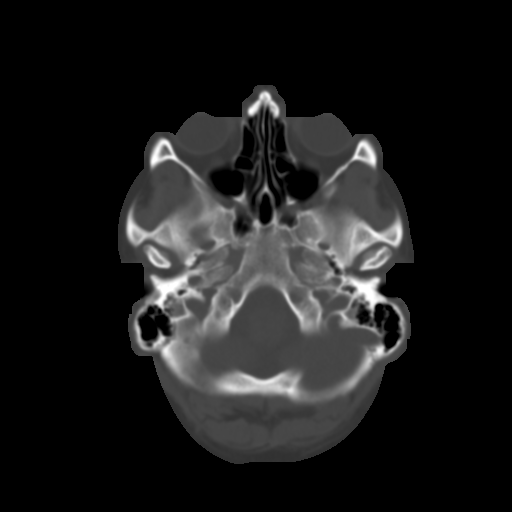
[im 6/30  brain]
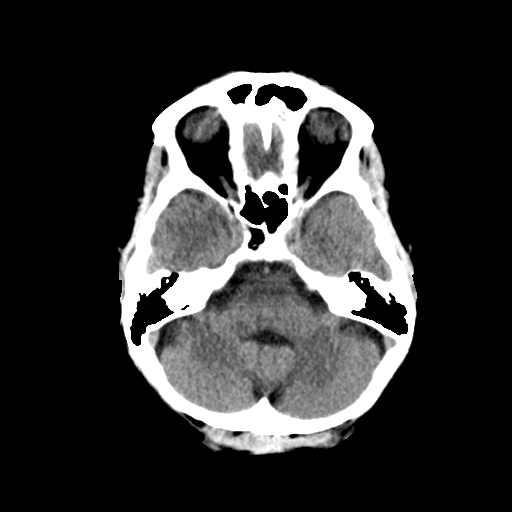
[im 9/30  brain]
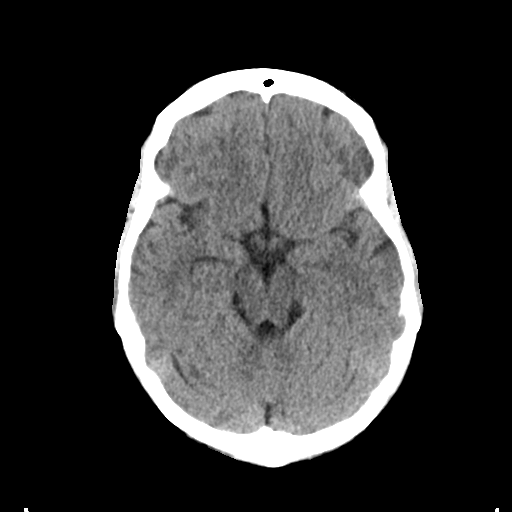
[im 11/30  brain]
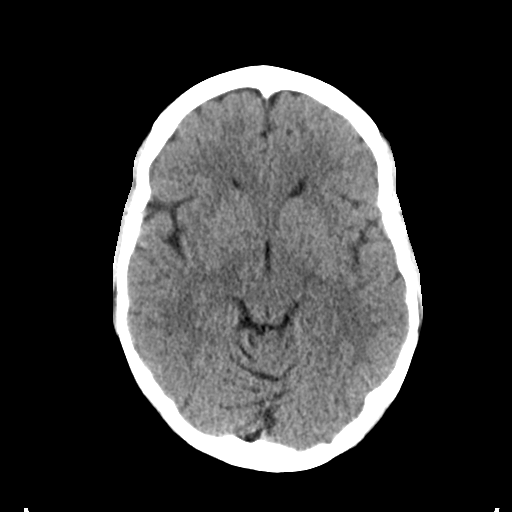
[im 14/30  brain]
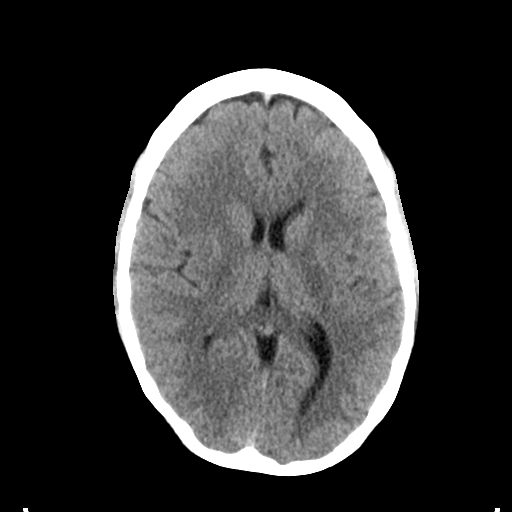
[im 14/30  bone]
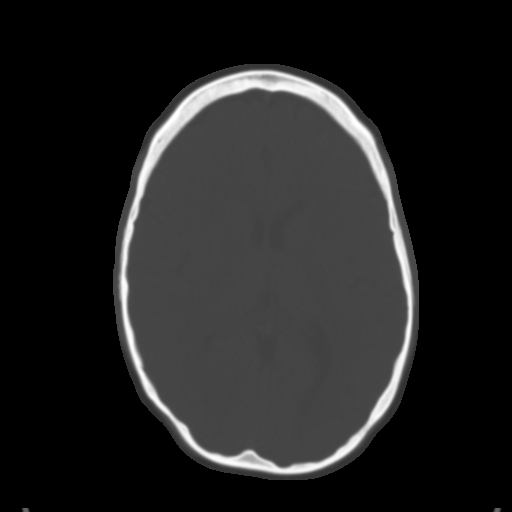
[im 17/30  brain]
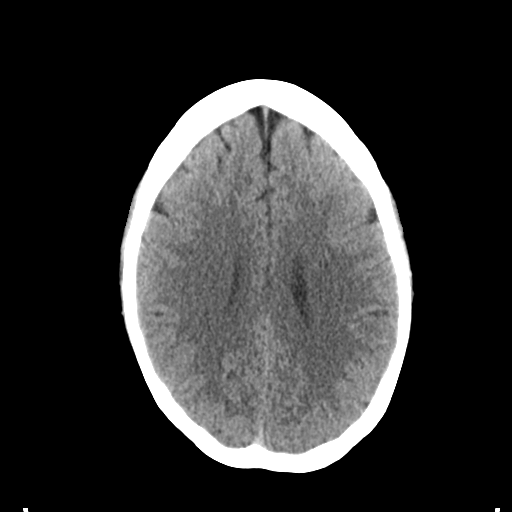
[im 20/30  brain]
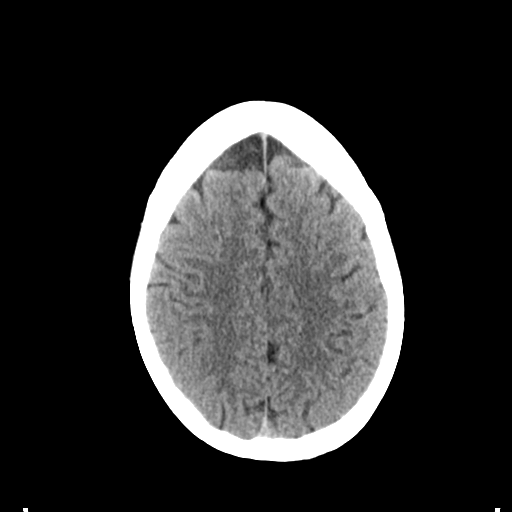
[im 23/30  brain]
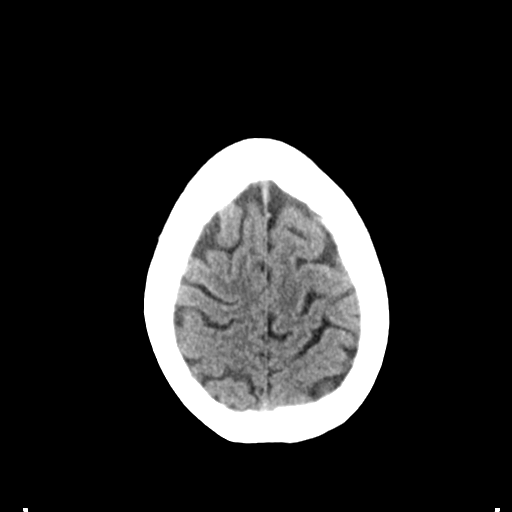
[im 25/30  brain]
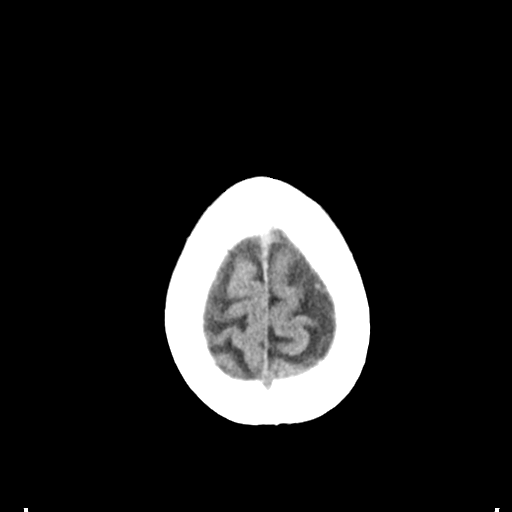
[im 25/30  bone]
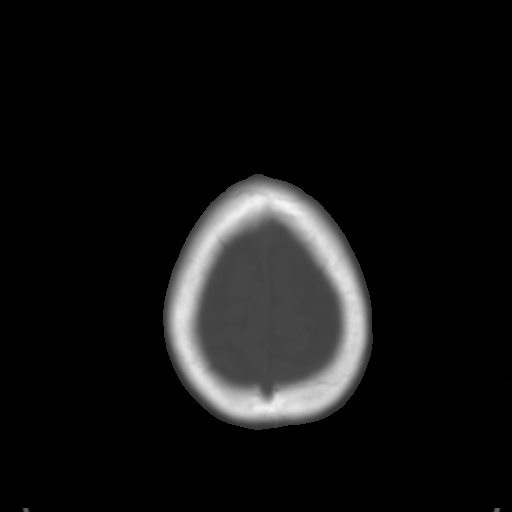
[im 28/30  brain]
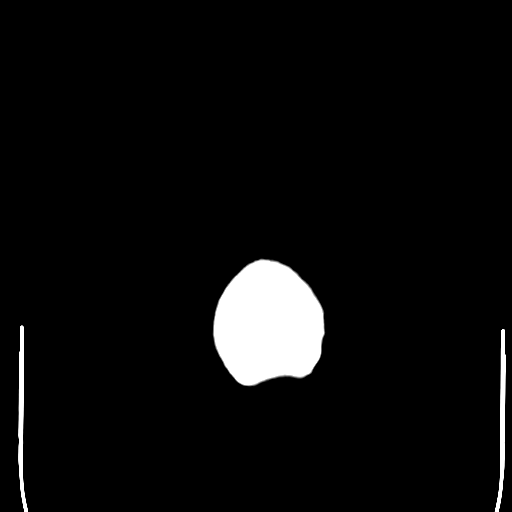

[Series 4: coronal soft tissue · coronal · 0.30mm/px · 3 of 64 slices shown]
[im 22/64  brain]
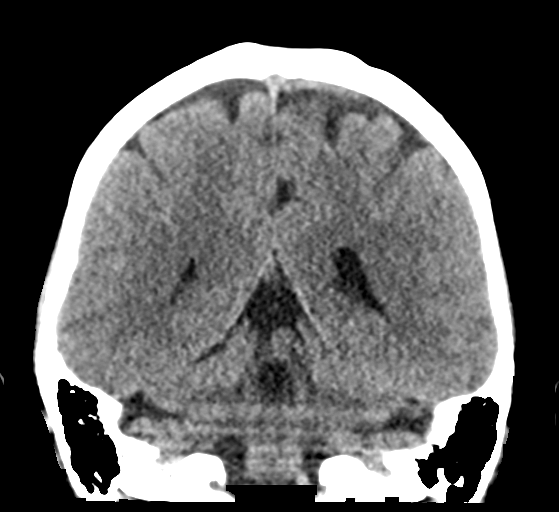
[im 29/64  brain]
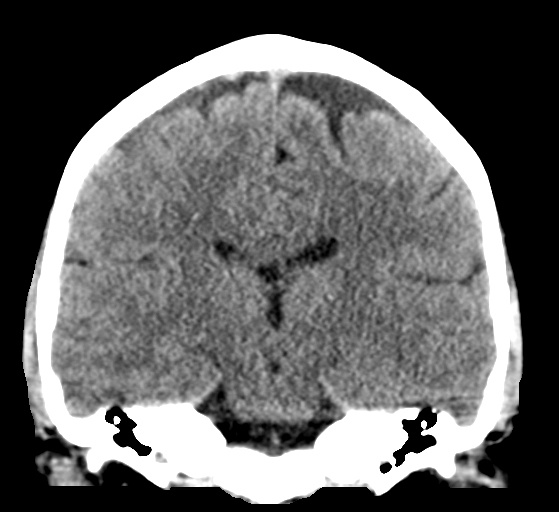
[im 36/64  brain]
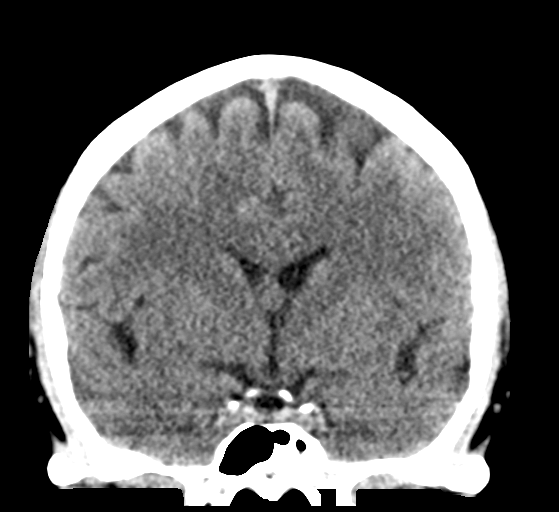

[Series 5: sagittal soft tissue · sagittal · 0.31mm/px · 3 of 52 slices shown]
[im 18/52  brain]
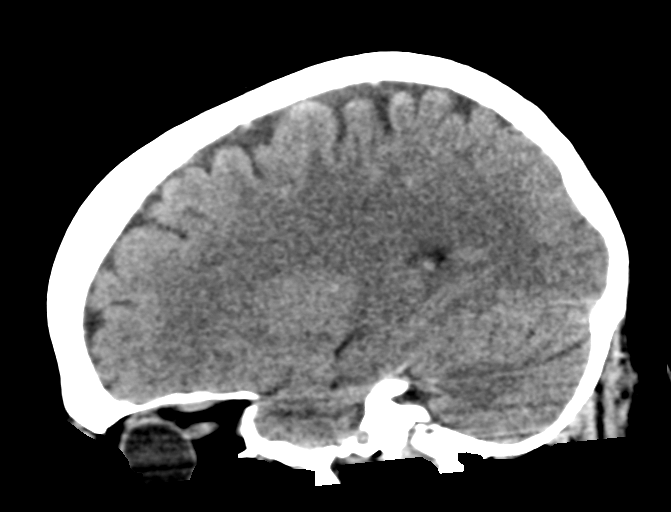
[im 26/52  brain]
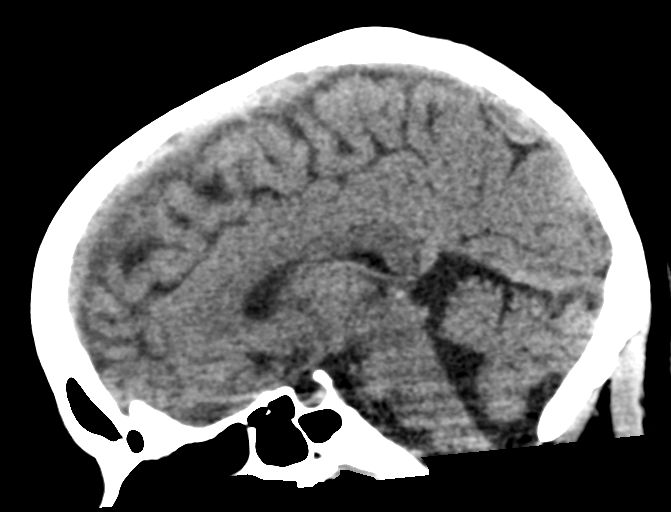
[im 35/52  brain]
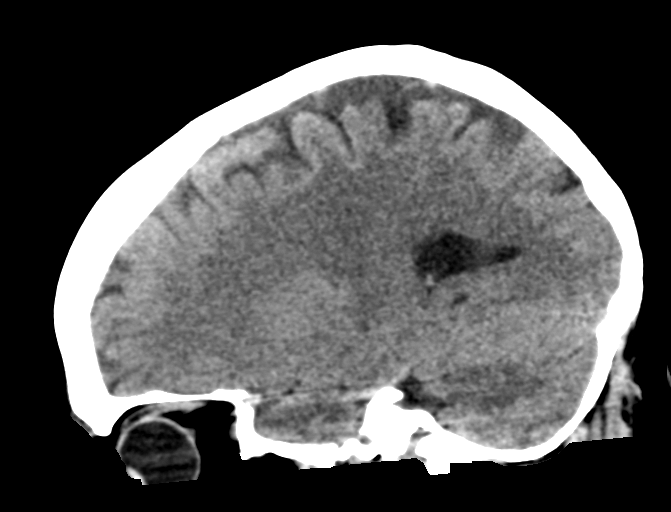

[16 of 47 positions shown; findings below may reference images not displayed]

FINDINGS: Brain: No evidence of acute infarction, hemorrhage, hydrocephalus,
extra-axial collection or mass lesion/mass effect.

Vascular: No hyperdense vessel or unexpected calcification.

Skull: Normal. Negative for fracture or focal lesion.

Sinuses/Orbits: No acute finding.

Other: None
IMPRESSION: Normal noncontrast CT of the brain.

## 2022-06-14 ENCOUNTER — Telehealth: Payer: BC Managed Care – PPO | Admitting: Physician Assistant

## 2022-06-14 DIAGNOSIS — R3989 Other symptoms and signs involving the genitourinary system: Secondary | ICD-10-CM | POA: Diagnosis not present

## 2022-06-14 MED ORDER — CEPHALEXIN 500 MG PO CAPS: 500.0000 mg | ORAL_CAPSULE | Freq: Two times a day (BID) | ORAL | 0 refills | Status: AC

## 2022-06-14 NOTE — Progress Notes (Signed)

## 2022-06-14 NOTE — Progress Notes (Signed)
I have spent 5 minutes in review of e-visit questionnaire, review and updating patient chart, medical decision making and response to patient.   Stormy Connon Cody Linkon Siverson, PA-C    

## 2022-12-05 ENCOUNTER — Telehealth: Payer: BC Managed Care – PPO | Admitting: Physician Assistant

## 2022-12-05 DIAGNOSIS — U071 COVID-19: Secondary | ICD-10-CM | POA: Diagnosis not present

## 2022-12-05 MED ORDER — FLUTICASONE PROPIONATE 50 MCG/ACT NA SUSP: 2.0000 | Freq: Every day | NASAL | 0 refills | Status: DC

## 2022-12-05 MED ORDER — PROMETHAZINE-DM 6.25-15 MG/5ML PO SYRP: 5.0000 mL | ORAL_SOLUTION | Freq: Four times a day (QID) | ORAL | 0 refills | Status: DC | PRN

## 2022-12-05 MED ORDER — LIDOCAINE VISCOUS HCL 2 % MT SOLN: OROMUCOSAL | 0 refills | Status: DC

## 2022-12-05 MED ORDER — BENZONATATE 100 MG PO CAPS: 100.0000 mg | ORAL_CAPSULE | Freq: Three times a day (TID) | ORAL | 0 refills | Status: DC | PRN

## 2022-12-05 MED ORDER — NAPROXEN 500 MG PO TABS: 500.0000 mg | ORAL_TABLET | Freq: Two times a day (BID) | ORAL | 0 refills | Status: DC

## 2022-12-05 NOTE — Addendum Note (Signed)
Addended by: Mar Daring on: 12/05/2022 11:00 AM   Modules accepted: Orders

## 2022-12-05 NOTE — Progress Notes (Signed)
E-Visit  for Positive Covid Test Result  We are sorry you are not feeling well. We are here to help!  You have tested positive for COVID-19, meaning that you were infected with the novel coronavirus and could give the virus to others.  It is vitally important that you stay home so you do not spread it to others.      Please continue isolation at home, for at least 10 days since the start of your symptoms and until you have had 24 hours with no fever (without taking a fever reducer) and with improving of symptoms.  If you have no symptoms but tested positive (or all symptoms resolve after 5 days and you have no fever) you can leave your house but continue to wear a mask around others for an additional 5 days. If you have a fever,continue to stay home until you have had 24 hours of no fever. Most cases improve 5-10 days from onset but we have seen a small number of patients who have gotten worse after the 10 days.  Please be sure to watch for worsening symptoms and remain taking the proper precautions.   Go to the nearest hospital ED for assessment if fever/cough/breathlessness are severe or illness seems like a threat to life.    The following symptoms may appear 2-14 days after exposure: Fever Cough Shortness of breath or difficulty breathing Chills Repeated shaking with chills Muscle pain Headache Sore throat New loss of taste or smell Fatigue Congestion or runny nose Nausea or vomiting Diarrhea  You have been enrolled in MyChart Home Monitoring for COVID-19. Daily you will receive a questionnaire within the MyChart website. Our COVID-19 response team will be monitoring your responses daily.  You can use medication such as prescription cough medication called Tessalon Perles 100 mg. You may take 1-2 capsules every 8 hours as needed for cough, prescription anti-inflammatory called Naprosyn 500 mg. Take twice daily as needed for fever or body aches for 2 weeks, and prescription for  Fluticasone nasal spray 2 sprays in each nostril one time per day  You may also take acetaminophen (Tylenol) as needed for fever.  HOME CARE: Only take medications as instructed by your medical team. Drink plenty of fluids and get plenty of rest. A steam or ultrasonic humidifier can help if you have congestion.   GET HELP RIGHT AWAY IF YOU HAVE EMERGENCY WARNING SIGNS.  Call 911 or proceed to your closest emergency facility if: You develop worsening high fever. Trouble breathing Bluish lips or face Persistent pain or pressure in the chest New confusion Inability to wake or stay awake You cough up blood. Your symptoms become more severe Inability to hold down food or fluids  This list is not all possible symptoms. Contact your medical provider for any symptoms that are severe or concerning to you.    Your e-visit answers were reviewed by a board certified advanced clinical practitioner to complete your personal care plan.  Depending on the condition, your plan could have included both over the counter or prescription medications.  If there is a problem please reply once you have received a response from your provider.  Your safety is important to us.  If you have drug allergies check your prescription carefully.    You can use MyChart to ask questions about today's visit, request a non-urgent call back, or ask for a work or school excuse for 24 hours related to this e-Visit. If it has been greater than 24 hours   you will need to follow up with your provider, or enter a new e-Visit to address those concerns. You will get an e-mail in the next two days asking about your experience.  I hope that your e-visit has been valuable and will speed your recovery. Thank you for using e-visits.   I have spent 5 minutes in review of e-visit questionnaire, review and updating patient chart, medical decision making and response to patient.   Margo Lama M Vikrant Pryce, PA-C  

## 2022-12-09 ENCOUNTER — Other Ambulatory Visit: Payer: Self-pay | Admitting: Obstetrics and Gynecology

## 2022-12-09 DIAGNOSIS — F3281 Premenstrual dysphoric disorder: Secondary | ICD-10-CM

## 2022-12-12 ENCOUNTER — Telehealth: Payer: Self-pay

## 2022-12-12 NOTE — Telephone Encounter (Signed)
Pt calling; has question about her sertraline rx.  [phone]

## 2022-12-13 NOTE — Telephone Encounter (Signed)
Patient is scheduled for 2/19 with ABC for annual

## 2022-12-15 ENCOUNTER — Other Ambulatory Visit: Payer: Self-pay | Admitting: Obstetrics and Gynecology

## 2022-12-15 DIAGNOSIS — F3281 Premenstrual dysphoric disorder: Secondary | ICD-10-CM

## 2022-12-15 MED ORDER — SERTRALINE HCL 50 MG PO TABS: 50.0000 mg | ORAL_TABLET | Freq: Every day | ORAL | 0 refills | Status: DC

## 2022-12-15 NOTE — Telephone Encounter (Signed)
Pt aware.

## 2022-12-15 NOTE — Progress Notes (Signed)
Rx RF sertraline till annual 2/24

## 2022-12-15 NOTE — Telephone Encounter (Signed)
Rx RF eRxd.  

## 2022-12-29 NOTE — Progress Notes (Signed)
PCP:  Trinna Post, PA-C (Inactive)   Chief Complaint  Patient presents with   Gynecologic Exam    No concerns     HPI:      Wendy Vargas is a 37 y.o. [phone] who LMP was Patient's last menstrual period was 12/12/2022 (approximate)., presents today for her annual examination.  Her menses are regular every 28-30 days, lasting 4 days, mod flow, no BTB, no dysmen. Had Paragard IUD removed last yr and menses much improved.  She has PMDD sx and takes zoloft 50 mg daily with sx improvement. Feels much better overall this yr.    Sex activity: single partner, contraception -- vasectomy. No pain/bleeding. Last Pap: 11/16/21 Results were: no abnormalities /neg HPV DNA  Hx of STDs: none  There is no FH of breast cancer. There is no FH of ovarian cancer. The patient does do self-breast exams. There is a FH of pancreatic cancer in her MGM. Pt is MyRisk neg 5/21, IBIS=12.7%/riskscore=15.1%.  Tobacco use: The patient denies current or previous tobacco use. Alcohol use: none No drug use.  Exercise: very active  She does get adequate calcium but not Vitamin D in her diet.   Past Medical History:  Diagnosis Date   Abnormal Pap smear of cervix    asc-us   Anxiety    BRCA negative 03/2020   MyRisk neg; IBIS=12.7%/riskscore=15.1%   Emphysema of lung (HCC)    Family history of pancreatic cancer    Gestational hypertension    pre-eclampsia   Myocardial infarction ([place])    Rh negative, antepartum, unspecified trimester    Twin pregnancy     Past Surgical History:  Procedure Laterality Date   WISDOM TOOTH EXTRACTION      Family History  Problem Relation Age of Onset   Depression Mother    Diabetes Father    Pancreatic cancer Maternal Grandmother 26   Stomach cancer Maternal Grandfather    Congestive Heart Failure Paternal Grandmother     Social History   Socioeconomic History   Marital status: Married    Spouse name: Not on file   Number of children: Not on file    Years of education: Not on file   Highest education level: Not on file  Occupational History   Not on file  Tobacco Use   Smoking status: Never   Smokeless tobacco: Never  Vaping Use   Vaping Use: Never used  Substance and Sexual Activity   Alcohol use: Yes    Alcohol/week: 5.0 standard drinks of alcohol    Types: 3 Glasses of wine, 2 Shots of liquor per week   Drug use: No   Sexual activity: Yes    Birth control/protection: Surgical    Comment: Vasectomy  Other Topics Concern   Not on file  Social History Narrative   Not on file   Social Determinants of Health   Financial Resource Strain: Not on file  Food Insecurity: Not on file  Transportation Needs: Not on file  Physical Activity: Not on file  Stress: Not on file  Social Connections: Not on file  Intimate Partner Violence: Not on file    Current Meds  Medication Sig   [DISCONTINUED] sertraline (ZOLOFT) 50 MG tablet Take 1 tablet (50 mg total) by mouth daily.     ROS:  Review of Systems  Constitutional:  Negative for fatigue, fever and unexpected weight change.  Respiratory:  Negative for cough, shortness of breath and wheezing.   Cardiovascular:  Negative  for chest pain, palpitations and leg swelling.  Gastrointestinal:  Negative for blood in stool, constipation, diarrhea, nausea and vomiting.  Endocrine: Negative for cold intolerance, heat intolerance and polyuria.  Genitourinary:  Negative for dyspareunia, dysuria, flank pain, frequency, genital sores, hematuria, menstrual problem, pelvic pain, urgency, vaginal bleeding, vaginal discharge and vaginal pain.  Musculoskeletal:  Negative for back pain, joint swelling and myalgias.  Skin:  Negative for rash.  Neurological:  Negative for dizziness, syncope, light-headedness, numbness and headaches.  Hematological:  Negative for adenopathy.  Psychiatric/Behavioral:  Negative for agitation, confusion, sleep disturbance and suicidal ideas. The patient is not  nervous/anxious.      Objective: BP 120/80   Ht 5' 7"$  (1.702 m)   Wt 227 lb (103 kg)   LMP 12/12/2022 (Approximate)   BMI 35.55 kg/m    Physical Exam Constitutional:      Appearance: She is well-developed.  Genitourinary:     Vulva normal.     Right Labia: No rash, tenderness or lesions.    Left Labia: No tenderness, lesions or rash.    No vaginal discharge, erythema or tenderness.      Right Adnexa: not tender and no mass present.    Left Adnexa: not tender and no mass present.    No cervical friability or polyp.     Uterus is not enlarged or tender.  Breasts:    Right: No mass, nipple discharge, skin change or tenderness.     Left: No mass, nipple discharge, skin change or tenderness.  Neck:     Thyroid: No thyromegaly.  Cardiovascular:     Rate and Rhythm: Normal rate and regular rhythm.     Heart sounds: Normal heart sounds. No murmur heard. Pulmonary:     Effort: Pulmonary effort is normal.     Breath sounds: Normal breath sounds.  Abdominal:     Palpations: Abdomen is soft.     Tenderness: There is no abdominal tenderness. There is no guarding or rebound.  Musculoskeletal:        General: Normal range of motion.     Cervical back: Normal range of motion.  Lymphadenopathy:     Cervical: No cervical adenopathy.  Neurological:     General: No focal deficit present.     Mental Status: She is alert and oriented to person, place, and time.     Cranial Nerves: No cranial nerve deficit.  Skin:    General: Skin is warm and dry.  Psychiatric:        Mood and Affect: Mood normal.        Behavior: Behavior normal.        Thought Content: Thought content normal.        Judgment: Judgment normal.  Vitals reviewed.    Assessment/Plan: Encounter for annual routine gynecological examination  PMDD (premenstrual dysphoric disorder) - Plan: sertraline (ZOLOFT) 50 MG tablet; Rx RF. Doing better this yr.    Meds ordered this encounter  Medications   sertraline  (ZOLOFT) 50 MG tablet    Sig: Take 1 tablet (50 mg total) by mouth daily.    Dispense:  90 tablet    Refill:  3    Order Specific Question:   Supervising Provider    Answer:   Renaldo Reel             GYN counsel adequate intake of calcium and vitamin D, diet and exercise     F/U  Return in about 1 year (around 01/03/2024).  Ottie Tillery B. Jerime Arif, PA-C 01/02/2023 9:12 AM

## 2023-01-02 ENCOUNTER — Encounter: Payer: Self-pay | Admitting: Obstetrics and Gynecology

## 2023-01-02 ENCOUNTER — Ambulatory Visit (INDEPENDENT_AMBULATORY_CARE_PROVIDER_SITE_OTHER): Payer: BC Managed Care – PPO | Admitting: Obstetrics and Gynecology

## 2023-01-02 VITALS — BP 120/80 | Ht 67.0 in | Wt 227.0 lb

## 2023-01-02 DIAGNOSIS — Z01419 Encounter for gynecological examination (general) (routine) without abnormal findings: Secondary | ICD-10-CM

## 2023-01-02 DIAGNOSIS — F3281 Premenstrual dysphoric disorder: Secondary | ICD-10-CM

## 2023-01-02 DIAGNOSIS — Z3041 Encounter for surveillance of contraceptive pills: Secondary | ICD-10-CM

## 2023-01-02 MED ORDER — SERTRALINE HCL 50 MG PO TABS: 50.0000 mg | ORAL_TABLET | Freq: Every day | ORAL | 3 refills | Status: DC

## 2023-01-02 NOTE — Patient Instructions (Signed)
I value your feedback and you entrusting us with your care. If you get a [facility] patient survey, I would appreciate you taking the time to let us know about your experience today. Thank you! ? ? ?

## 2023-03-15 ENCOUNTER — Other Ambulatory Visit: Payer: Self-pay | Admitting: Obstetrics and Gynecology

## 2023-03-15 DIAGNOSIS — F3281 Premenstrual dysphoric disorder: Secondary | ICD-10-CM

## 2024-01-04 ENCOUNTER — Other Ambulatory Visit: Payer: Self-pay | Admitting: Obstetrics and Gynecology

## 2024-01-04 DIAGNOSIS — F3281 Premenstrual dysphoric disorder: Secondary | ICD-10-CM

## 2024-01-18 ENCOUNTER — Telehealth: Payer: Self-pay

## 2024-01-18 ENCOUNTER — Other Ambulatory Visit: Payer: Self-pay | Admitting: Obstetrics and Gynecology

## 2024-01-18 DIAGNOSIS — F3281 Premenstrual dysphoric disorder: Secondary | ICD-10-CM

## 2024-01-18 MED ORDER — SERTRALINE HCL 50 MG PO TABS: 50.0000 mg | ORAL_TABLET | Freq: Every day | ORAL | 0 refills | Status: DC

## 2024-01-18 NOTE — Progress Notes (Signed)
 Rx RF sertraline till 3/25 annual

## 2024-01-18 NOTE — Telephone Encounter (Signed)
 Patient called she needs her Zoloft refilled. She has already scheduled an appointment.

## 2024-01-18 NOTE — Telephone Encounter (Signed)
Rx RF eRxd.  

## 2024-01-19 NOTE — Telephone Encounter (Signed)
 Pt aware.

## 2024-01-24 NOTE — Progress Notes (Unsigned)
 PCP:  Trey Sailors, PA-C   No chief complaint on file.    HPI:      [name] is a 38 y.o. [phone] who LMP was No LMP recorded., presents today for her annual examination.  Her menses are regular every 28-30 days, lasting 4 days, mod flow, no BTB, no dysmen. Had Paragard IUD removed last yr and menses much improved.  She has PMDD sx and takes zoloft 50 mg daily with sx improvement. Feels much better overall this yr.    Sex activity: single partner, contraception -- vasectomy. No pain/bleeding. Last Pap: 11/16/21 Results were: no abnormalities /neg HPV DNA  Hx of STDs: none  There is no FH of breast cancer. There is no FH of ovarian cancer. The patient does do self-breast exams. There is a FH of pancreatic cancer in her MGM. Pt is MyRisk neg 5/21, IBIS=12.7%/riskscore=15.1%.  Tobacco use: The patient denies current or previous tobacco use. Alcohol use: none No drug use.  Exercise: very active  She does get adequate calcium but not Vitamin D in her diet.   Past Medical History:  Diagnosis Date   Abnormal Pap smear of cervix    asc-us   Anxiety    BRCA negative 03/2020   MyRisk neg; IBIS=12.7%/riskscore=15.1%   Emphysema of lung (HCC)    Family history of pancreatic cancer    Gestational hypertension    pre-eclampsia   Myocardial infarction (HCC)    Rh negative, antepartum, unspecified trimester    Twin pregnancy     Past Surgical History:  Procedure Laterality Date   WISDOM TOOTH EXTRACTION      Family History  Problem Relation Age of Onset   Depression Mother    Diabetes Father    Pancreatic cancer Maternal Grandmother 27   Stomach cancer Maternal Grandfather    Congestive Heart Failure Paternal Grandmother     Social History   Socioeconomic History   Marital status: Married    Spouse name: Not on file   Number of children: Not on file   Years of education: Not on file   Highest education level: Not on file  Occupational History   Not  on file  Tobacco Use   Smoking status: Never   Smokeless tobacco: Never  Vaping Use   Vaping status: Never Used  Substance and Sexual Activity   Alcohol use: Yes    Alcohol/week: 5.0 standard drinks of alcohol    Types: 3 Glasses of wine, 2 Shots of liquor per week   Drug use: No   Sexual activity: Yes    Birth control/protection: Surgical    Comment: Vasectomy  Other Topics Concern   Not on file  Social History Narrative   Not on file   Social Drivers of Health   Financial Resource Strain: Not on file  Food Insecurity: Not on file  Transportation Needs: Not on file  Physical Activity: Not on file  Stress: Not on file  Social Connections: Not on file  Intimate Partner Violence: Not on file    No outpatient medications have been marked as taking for the 01/25/24 encounter (Appointment) with Lawrencia Mauney, Ilona Sorrel, PA-C.     ROS:  Review of Systems  Constitutional:  Negative for fatigue, fever and unexpected weight change.  Respiratory:  Negative for cough, shortness of breath and wheezing.   Cardiovascular:  Negative for chest pain, palpitations and leg swelling.  Gastrointestinal:  Negative for blood in stool, constipation, diarrhea, nausea and vomiting.  Endocrine: Negative for cold intolerance, heat intolerance and polyuria.  Genitourinary:  Negative for dyspareunia, dysuria, flank pain, frequency, genital sores, hematuria, menstrual problem, pelvic pain, urgency, vaginal bleeding, vaginal discharge and vaginal pain.  Musculoskeletal:  Negative for back pain, joint swelling and myalgias.  Skin:  Negative for rash.  Neurological:  Negative for dizziness, syncope, light-headedness, numbness and headaches.  Hematological:  Negative for adenopathy.  Psychiatric/Behavioral:  Negative for agitation, confusion, sleep disturbance and suicidal ideas. The patient is not nervous/anxious.      Objective: There were no vitals taken for this visit.   Physical  Exam Constitutional:      Appearance: She is well-developed.  Genitourinary:     Vulva normal.     Right Labia: No rash, tenderness or lesions.    Left Labia: No tenderness, lesions or rash.    No vaginal discharge, erythema or tenderness.      Right Adnexa: not tender and no mass present.    Left Adnexa: not tender and no mass present.    No cervical friability or polyp.     Uterus is not enlarged or tender.  Breasts:    Right: No mass, nipple discharge, skin change or tenderness.     Left: No mass, nipple discharge, skin change or tenderness.  Neck:     Thyroid: No thyromegaly.  Cardiovascular:     Rate and Rhythm: Normal rate and regular rhythm.     Heart sounds: Normal heart sounds. No murmur heard. Pulmonary:     Effort: Pulmonary effort is normal.     Breath sounds: Normal breath sounds.  Abdominal:     Palpations: Abdomen is soft.     Tenderness: There is no abdominal tenderness. There is no guarding or rebound.  Musculoskeletal:        General: Normal range of motion.     Cervical back: Normal range of motion.  Lymphadenopathy:     Cervical: No cervical adenopathy.  Neurological:     General: No focal deficit present.     Mental Status: She is alert and oriented to person, place, and time.     Cranial Nerves: No cranial nerve deficit.  Skin:    General: Skin is warm and dry.  Psychiatric:        Mood and Affect: Mood normal.        Behavior: Behavior normal.        Thought Content: Thought content normal.        Judgment: Judgment normal.  Vitals reviewed.    Assessment/Plan: Encounter for annual routine gynecological examination  PMDD (premenstrual dysphoric disorder) - Plan: sertraline (ZOLOFT) 50 MG tablet; Rx RF. Doing better this yr.    No orders of the defined types were placed in this encounter.            GYN counsel adequate intake of calcium and vitamin D, diet and exercise     F/U  No follow-ups on file.  Undrea Archbold B. Lannis Lichtenwalner,  PA-C 01/24/2024 3:43 PM

## 2024-01-25 ENCOUNTER — Ambulatory Visit (INDEPENDENT_AMBULATORY_CARE_PROVIDER_SITE_OTHER): Admitting: Obstetrics and Gynecology

## 2024-01-25 ENCOUNTER — Encounter: Payer: Self-pay | Admitting: Obstetrics and Gynecology

## 2024-01-25 VITALS — BP 136/85 | HR 76 | Ht 67.0 in | Wt 237.0 lb

## 2024-01-25 DIAGNOSIS — Z0000 Encounter for general adult medical examination without abnormal findings: Secondary | ICD-10-CM

## 2024-01-25 DIAGNOSIS — Z1329 Encounter for screening for other suspected endocrine disorder: Secondary | ICD-10-CM

## 2024-01-25 DIAGNOSIS — Z131 Encounter for screening for diabetes mellitus: Secondary | ICD-10-CM

## 2024-01-25 DIAGNOSIS — Z6837 Body mass index (BMI) 37.0-37.9, adult: Secondary | ICD-10-CM

## 2024-01-25 DIAGNOSIS — N6310 Unspecified lump in the right breast, unspecified quadrant: Secondary | ICD-10-CM

## 2024-01-25 DIAGNOSIS — R635 Abnormal weight gain: Secondary | ICD-10-CM

## 2024-01-25 DIAGNOSIS — Z01419 Encounter for gynecological examination (general) (routine) without abnormal findings: Secondary | ICD-10-CM | POA: Diagnosis not present

## 2024-01-25 DIAGNOSIS — F3281 Premenstrual dysphoric disorder: Secondary | ICD-10-CM

## 2024-01-25 MED ORDER — SERTRALINE HCL 50 MG PO TABS: 50.0000 mg | ORAL_TABLET | Freq: Every day | ORAL | 3 refills | Status: DC

## 2024-01-25 NOTE — Patient Instructions (Signed)
 I value your feedback and you entrusting [place] with your care. If you get a [place] patient survey, I would appreciate you taking the time to let us know about your experience today. Thank you! ? ? ?

## 2024-01-26 LAB — HEMOGLOBIN A1C
Est. average glucose Bld gHb Est-mCnc: 114 mg/dL
Hgb A1c MFr Bld: 5.6 % (ref 4.8–5.6)

## 2024-01-26 LAB — COMPREHENSIVE METABOLIC PANEL
ALT: 19 IU/L (ref 0–32)
AST: 18 IU/L (ref 0–40)
Albumin: 4.8 g/dL (ref 3.9–4.9)
Alkaline Phosphatase: 65 IU/L (ref 44–121)
BUN/Creatinine Ratio: 12 (ref 9–23)
BUN: 11 mg/dL (ref 6–20)
Bilirubin Total: 0.3 mg/dL (ref 0.0–1.2)
CO2: 21 mmol/L (ref 20–29)
Calcium: 10 mg/dL (ref 8.7–10.2)
Chloride: 100 mmol/L (ref 96–106)
Creatinine, Ser: 0.89 mg/dL (ref 0.57–1.00)
Globulin, Total: 3 g/dL (ref 1.5–4.5)
Glucose: 101 mg/dL — ABNORMAL HIGH (ref 70–99)
Potassium: 4.2 mmol/L (ref 3.5–5.2)
Sodium: 137 mmol/L (ref 134–144)
Total Protein: 7.8 g/dL (ref 6.0–8.5)
eGFR: 85 mL/min/{1.73_m2} (ref >59)

## 2024-01-26 LAB — T4, FREE: Free T4: 1.1 ng/dL (ref 0.82–1.77)

## 2024-01-26 LAB — TSH: TSH: 1.77 u[IU]/mL (ref 0.450–4.500)

## 2024-01-28 ENCOUNTER — Encounter: Payer: Self-pay | Admitting: Obstetrics and Gynecology

## 2024-04-16 ENCOUNTER — Other Ambulatory Visit: Payer: Self-pay | Admitting: Obstetrics and Gynecology

## 2024-04-16 DIAGNOSIS — F3281 Premenstrual dysphoric disorder: Secondary | ICD-10-CM

## 2024-05-03 ENCOUNTER — Telehealth: Admitting: Physician Assistant

## 2024-05-03 DIAGNOSIS — R197 Diarrhea, unspecified: Secondary | ICD-10-CM

## 2024-05-03 DIAGNOSIS — R112 Nausea with vomiting, unspecified: Secondary | ICD-10-CM

## 2024-05-03 NOTE — Progress Notes (Signed)
 [place]

## 2024-08-05 ENCOUNTER — Telehealth: Payer: Self-pay

## 2024-08-05 ENCOUNTER — Telehealth: Admitting: Family Medicine

## 2024-08-05 DIAGNOSIS — I10 Essential (primary) hypertension: Secondary | ICD-10-CM

## 2024-08-05 DIAGNOSIS — R030 Elevated blood-pressure reading, without diagnosis of hypertension: Secondary | ICD-10-CM

## 2024-08-05 MED ORDER — VALSARTAN 40 MG PO TABS: 40.0000 mg | ORAL_TABLET | Freq: Every day | ORAL | 0 refills | Status: DC

## 2024-08-05 NOTE — Telephone Encounter (Signed)
 Called pt back. Pt had similar BP spike sx in 2022, Rxd amlodipine . Sx eventually improved with lifestyle modification. Hasn't needed BP meds since. Pt stated she felt like she was going to pass out with exercise on Friday when everything went dark. No syncope because sat down quickly. Is feeling good mentally--no extra stress, no anxiety, improved PMDD. Still on sertraline , doing well.  Hasn't been feeling well today but denies any HA/vision changes. BP as stated above. Has f/u with PCP in Oct but to go to ED if BP remains elevated, per NP.   Rx losartan eRxd. Pt to take BP daily in meantime and go to ED if as high as this AM. Also d/c caffeine and sodium intake (been drinking lots of electrolytes recently with increased sodium in them). Pt to f/u with PCP again if BP not improving; keep journal for now.   Meds ordered this encounter  Medications   valsartan  (DIOVAN ) 40 MG tablet    Sig: Take 1 tablet (40 mg total) by mouth daily.    Dispense:  30 tablet    Refill:  0    Supervising Provider:   LEIGH SOBER [[id number]]

## 2024-08-05 NOTE — Addendum Note (Signed)
 Addended by: WATT HILA B on: 08/05/2024 04:43 PM   Modules accepted: Orders

## 2024-08-05 NOTE — Progress Notes (Signed)
 [place]   Needs to be seen in person given the elevation of BP  Readings: 189/120;  170/90  On Friday- Felt like she was going to black out- and got sick. Felt better once stopping cycling. Has been anxious since, and this is causing more elevation as well. Reports not feeling the best as today.  Notes she has elevation in 2022 and went to ED and was placed on meds- but it improved with diet and lifestyle and she stopped the meds.   She is going to reach out to GYN - who is currently been caring for her like a PCP until she can be seen in PCP office- provided with number to referral line.  Patient acknowledged agreement and understanding of the plan.

## 2024-08-05 NOTE — Telephone Encounter (Signed)
 Patient calling in with concerns of hypertension. She reports feeling like she has been in fight or flight for a few days and that she hasn't felt good or even herself lately. This morning her readings this morning were 189/120, 170/90 and she hasn't checked it again due to anxiety. She had a virtual visit this morning made through MyChart and has now made an appointment on 10/14 with [facility].to establish care. She reports wanting to reach out to you because you have helped her with this in a similar situation before and you are the only provider she routinely sees. Please advise.

## 2024-08-27 ENCOUNTER — Encounter: Payer: Self-pay | Admitting: Family Medicine

## 2024-08-27 ENCOUNTER — Ambulatory Visit (INDEPENDENT_AMBULATORY_CARE_PROVIDER_SITE_OTHER): Admitting: Family Medicine

## 2024-08-27 VITALS — BP 120/81 | HR 67 | Temp 98.4°F | Ht 67.0 in | Wt 213.5 lb

## 2024-08-27 DIAGNOSIS — F3281 Premenstrual dysphoric disorder: Secondary | ICD-10-CM

## 2024-08-27 DIAGNOSIS — Z130 Encounter for screening for diseases of the blood and blood-forming organs and certain disorders involving the immune mechanism: Secondary | ICD-10-CM

## 2024-08-27 DIAGNOSIS — Z7689 Persons encountering health services in other specified circumstances: Secondary | ICD-10-CM

## 2024-08-27 DIAGNOSIS — I10 Essential (primary) hypertension: Secondary | ICD-10-CM

## 2024-08-27 DIAGNOSIS — Z23 Encounter for immunization: Secondary | ICD-10-CM | POA: Diagnosis not present

## 2024-08-27 DIAGNOSIS — Z1231 Encounter for screening mammogram for malignant neoplasm of breast: Secondary | ICD-10-CM

## 2024-08-27 DIAGNOSIS — Z136 Encounter for screening for cardiovascular disorders: Secondary | ICD-10-CM

## 2024-08-27 DIAGNOSIS — Z1159 Encounter for screening for other viral diseases: Secondary | ICD-10-CM

## 2024-08-27 DIAGNOSIS — Z114 Encounter for screening for human immunodeficiency virus [HIV]: Secondary | ICD-10-CM

## 2024-08-27 MED ORDER — SERTRALINE HCL 50 MG PO TABS: 50.0000 mg | ORAL_TABLET | Freq: Every day | ORAL | 1 refills | Status: AC

## 2024-08-27 MED ORDER — VALSARTAN 80 MG PO TABS: 80.0000 mg | ORAL_TABLET | Freq: Every day | ORAL | 3 refills | Status: AC

## 2024-08-27 NOTE — Patient Instructions (Signed)
 Please call the [facility] [phone]) to schedule a routine screening mammogram.

## 2024-08-27 NOTE — Assessment & Plan Note (Signed)
 Newly diagnosed.  Started valsartan  40 mg 08/05/2024.  Blood pressure improved today but still borderline.  Will increase valsartan  to 80 mg daily.

## 2024-08-27 NOTE — Progress Notes (Signed)
 New patient visit   Patient: Wendy Vargas   DOB: [birth year]   38 y.o. Female  MRN: [id number] Visit Date: 08/27/2024  Today's healthcare provider: LAURAINE LOISE BUOY, DO   Chief Complaint  Patient presents with   New Patient (Initial Visit)    -Patient is here to establish care with a primary provider. -Had issues with blood pressure about a month ago, receiving medication refills.  Hepatitis C Screening- yes HIV Screening- yes  All vaccines declined.  Mammogram- ok to order   Subjective    Wendy Vargas is a 38 y.o. female who presents today as a new patient to establish care.  HPI HPI     New Patient (Initial Visit)    Additional comments: -Patient is here to establish care with a primary provider. -Had issues with blood pressure about a month ago, receiving medication refills.  Hepatitis C Screening- yes HIV Screening- yes  All vaccines declined.  Mammogram- ok to order      Last edited by Terrel Powell CROME, CMA on 08/27/2024  3:26 PM.      Wendy Vargas is a 38 year old female with hypertension who presents to establish care and address high blood pressure issues.  She has been experiencing elevated blood pressure and is seeking assistance in managing it. 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 She was previously prescribed valsartan  for her hypertension by her GYN. She takes valsartan  at night. She reports that after starting the medication, she began to feel better and has not experienced any dizziness or nausea since then.  She has not had any issues since resuming her classes.  She is also taking sertraline  (Zoloft ) and magnesium regularly. Over the summer, she was treated with metronidazole and ciprofloxacin for a gastrointestinal illness that caused persistent nausea.  Additionally, she has been using a semaglutide compounded with B12 since April,  which she obtains from a detox center and is not covered by her insurance. She believes the dose is around 1.2, and she has not experienced any side effects from it.  In terms of her vaccination history, she declined the flu shot and COVID booster. She received the hepatitis B vaccine series during her school years but did not receive the HPV vaccine and does not wish to. Her last tetanus vaccine was likely over ten years ago when she had her children.  She stopped checking her blood pressure at home because it caused her anxiety, but she is open to checking it once a week. Her husband suggested she stop monitoring it daily. Her blood pressure was once as high as 185/130, which prompted her to consider going to the ER, but it has since improved.    Past Medical History:  Diagnosis Date   Abnormal Pap smear of cervix    asc-us    Anxiety    BRCA negative 03/2020   MyRisk neg; IBIS=12.7%/riskscore=15.1%   Depression    Family history of pancreatic cancer    Gestational hypertension    pre-eclampsia   Rh negative, antepartum, unspecified trimester    Twin pregnancy    Past Surgical History:  Procedure Laterality Date   WISDOM TOOTH EXTRACTION     Family Status  Relation Name Status   Mother  Alive   Father  Alive   Wendy Vargas  (Not Specified)   Wendy Vargas  (Not Specified)   MGM  Deceased  MGF  Deceased   PGM  Deceased   PGF  Deceased  No partnership data on file   Family History  Problem Relation Age of Onset   Depression Mother    Hypertension Mother    Diabetes Father    Cancer Father    Diabetes Paternal Aunt    Diabetes Paternal Uncle    Pancreatic cancer Maternal Grandmother 34   Hypertension Maternal Grandmother    Stomach cancer Maternal Grandfather    Congestive Heart Failure Paternal Grandmother    Diabetes Paternal Grandfather    Social History   Socioeconomic History   Marital status: Married    Spouse name: Not on file   Number of children: Not on file    Years of education: Not on file   Highest education level: Not on file  Occupational History   Not on file  Tobacco Use   Smoking status: Never   Smokeless tobacco: Never  Vaping Use   Vaping status: Never Used  Substance and Sexual Activity   Alcohol use: Yes    Alcohol/week: 5.0 standard drinks of alcohol    Types: 3 Glasses of wine, 2 Shots of liquor per week    Comment: socially   Drug use: No   Sexual activity: Yes    Birth control/protection: Surgical    Comment: Vasectomy  Other Topics Concern   Not on file  Social History Narrative   Not on file   Social Drivers of Health   Financial Resource Strain: Not on file  Food Insecurity: Not on file  Transportation Needs: Not on file  Physical Activity: Not on file  Stress: Not on file  Social Connections: Not on file   Outpatient Medications Prior to Visit  Medication Sig   magnesium gluconate (MAGONATE) 500 (27 Mg) MG TABS tablet Take 500 mg by mouth daily.   SEMAGLUTIDE-WEIGHT MANAGEMENT [place] Inject into the skin once a week.   [DISCONTINUED] ciprofloxacin (CIPRO) 500 MG tablet Take 500 mg by mouth 2 (two) times daily.   [DISCONTINUED] metroNIDAZOLE (FLAGYL) 500 MG tablet Take 500 mg by mouth 3 (three) times daily.   [DISCONTINUED] ondansetron (ZOFRAN-ODT) 8 MG disintegrating tablet Take 8 mg by mouth once.   [DISCONTINUED] sertraline  (ZOLOFT ) 50 MG tablet Take 1 tablet (50 mg total) by mouth daily.   [DISCONTINUED] valsartan  (DIOVAN ) 40 MG tablet Take 1 tablet (40 mg total) by mouth daily.   No facility-administered medications prior to visit.   Allergies  Allergen Reactions   Vicodin [Hydrocodone-Acetaminophen]     Immunization History  Administered Date(s) Administered   Influenza-Unspecified 11/20/2017   Moderna Sars-Covid-2 Vaccination 01/27/2020, 02/24/2020, 11/04/2020   Tdap 08/27/2024    Health Maintenance  Topic Date Due   HIV Screening  Never done   Hepatitis C Screening  Never done    Mammogram  Never done   Influenza Vaccine  02/11/2025 (Originally 06/14/2024)   COVID-19 Vaccine (4 - 2025-26 season) 07/15/2025 (Originally 07/15/2024)   HPV VACCINES (1 - 3-dose SCDM series) 08/27/2025 (Originally 05/27/2011)   Cervical Cancer Screening (HPV/Pap Cotest)  11/16/2026   DTaP/Tdap/Td (2 - Td or Tdap) 08/27/2034   Pneumococcal Vaccine  Aged Out   Meningococcal B Vaccine  Aged Out   Hepatitis B Vaccines 19-59 Average Risk  Discontinued    Patient Care Team: Donzella Lauraine SAILOR, DO as PCP - General (Family Medicine)  Review of Systems  Respiratory:  Negative for chest tightness and shortness of breath.   Cardiovascular:  Negative for chest  pain and palpitations.  Neurological:  Negative for dizziness, weakness and headaches.        Objective    BP 120/81 (BP Location: Left Arm, Patient Position: Sitting, Cuff Size: Normal)   Pulse 67   Temp 98.4 F (36.9 C) (Oral)   Ht 5' 7 (1.702 m)   Wt 213 lb 8 oz (96.8 kg)   SpO2 99%   BMI 33.44 kg/m     Physical Exam Constitutional:      Appearance: Normal appearance.  HENT:     Head: Normocephalic and atraumatic.  Eyes:     General: No scleral icterus.    Extraocular Movements: Extraocular movements intact.     Conjunctiva/sclera: Conjunctivae normal.  Cardiovascular:     Rate and Rhythm: Normal rate and regular rhythm.     Pulses: Normal pulses.     Heart sounds: Normal heart sounds.  Pulmonary:     Effort: Pulmonary effort is normal. No respiratory distress.     Breath sounds: Normal breath sounds.  Skin:    General: Skin is warm and dry.  Neurological:     Mental Status: She is alert and oriented to person, place, and time. Mental status is at baseline.  Psychiatric:        Mood and Affect: Mood normal.        Behavior: Behavior normal.     Depression Screen    08/27/2024    3:33 PM 01/25/2024    1:12 PM 01/13/2021   10:59 AM  PHQ 2/9 Scores  PHQ - 2 Score 0 2 2  PHQ- 9 Score 3 9 10    No results found  for any visits on 08/27/24.  Assessment & Plan     Essential hypertension Assessment & Plan: Newly diagnosed.  Started valsartan  40 mg 08/05/2024.  Blood pressure improved today but still borderline.  Will increase valsartan  to 80 mg daily.  Orders: -     Microalbumin / creatinine urine ratio -     Valsartan ; Take 1 tablet (80 mg total) by mouth daily.  Dispense: 90 tablet; Refill: 3  Establishing care with new doctor, encounter for  PMDD (premenstrual dysphoric disorder) Assessment & Plan: Chronic, stable.  Continue sertraline  50 mg daily.  Refilled today  Orders: -     Sertraline  HCl; Take 1 tablet (50 mg total) by mouth daily.  Dispense: 90 tablet; Refill: 1  Need for diphtheria-tetanus-pertussis (Tdap) vaccine -     Tdap vaccine greater than or equal to 7yo IM  Encounter for screening for HIV -     HIV Antibody (routine testing w rflx)  Encounter for hepatitis C screening test for low risk patient -     Hepatitis C antibody  Screening for endocrine, metabolic and immunity disorder  Encounter for screening for cardiovascular disorders -     Lipid panel  Encounter for screening mammogram for breast cancer -     3D Screening Mammogram, Left and Right; Future    General Health Maintenance Declined flu and COVID vaccines. Completed hepatitis B series. Declines HPV vaccine. Due for Tdap. Blood work and mammogram planned. - Administer tetanus vaccine. - Order blood work including cholesterol panel and kidney function. - Order mammogram and provide scheduling information for [facility].   Return in about 6 weeks (around 10/08/2024) for CPE, HTN.     I discussed the assessment and treatment plan with the patient  The patient was provided an opportunity to ask questions and all were answered. The  patient agreed with the plan and demonstrated an understanding of the instructions.   The patient was advised to call back or seek an in-person evaluation if the  symptoms worsen or if the condition fails to improve as anticipated.    LAURAINE LOISE BUOY, DO  [facility] [phone] (phone) [phone] (fax)  [facility]

## 2024-08-27 NOTE — Assessment & Plan Note (Signed)
 Chronic, stable.  Continue sertraline  50 mg daily.  Refilled today

## 2024-09-01 ENCOUNTER — Other Ambulatory Visit: Payer: Self-pay | Admitting: Obstetrics and Gynecology

## 2024-09-01 DIAGNOSIS — I10 Essential (primary) hypertension: Secondary | ICD-10-CM

## 2024-10-28 ENCOUNTER — Encounter: Admitting: Family Medicine

## 2024-11-28 ENCOUNTER — Encounter: Admitting: Family Medicine

## 2024-11-28 DIAGNOSIS — I10 Essential (primary) hypertension: Secondary | ICD-10-CM
